# Patient Record
Sex: Male | Born: 1970
Health system: Southern US, Community
[De-identification: ages and names within clinical notes are randomized; demographics above are authoritative.]

## PROBLEM LIST (undated history)

## (undated) DIAGNOSIS — G4730 Sleep apnea, unspecified: Secondary | ICD-10-CM

## (undated) DIAGNOSIS — M1990 Unspecified osteoarthritis, unspecified site: Secondary | ICD-10-CM

## (undated) DIAGNOSIS — E785 Hyperlipidemia, unspecified: Secondary | ICD-10-CM

## (undated) HISTORY — DX: Sleep apnea, unspecified: G47.30

## (undated) HISTORY — DX: Unspecified osteoarthritis, unspecified site: M19.90

## (undated) HISTORY — DX: Hyperlipidemia, unspecified: E78.5

---
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
## 2008-01-24 ENCOUNTER — Emergency Department (HOSPITAL_COMMUNITY): Admission: EM | Admit: 2008-01-24 | Discharge: 2008-01-24 | Payer: Self-pay | Admitting: Internal Medicine

## 2008-10-04 ENCOUNTER — Encounter: Admission: RE | Admit: 2008-10-04 | Discharge: 2008-10-04 | Payer: Self-pay | Admitting: Family Medicine

## 2011-01-11 IMAGING — US US ABDOMEN COMPLETE
1 series · 14 of 25 positions shown · non-contrast
Comparison: 

CLINICAL DATA: Right upper quadrant pain.

ABDOMINAL ULTRASOUND COMPLETE

[Series 1: us abdomen complete · 14 of 57 slices shown]
[im 1/57]
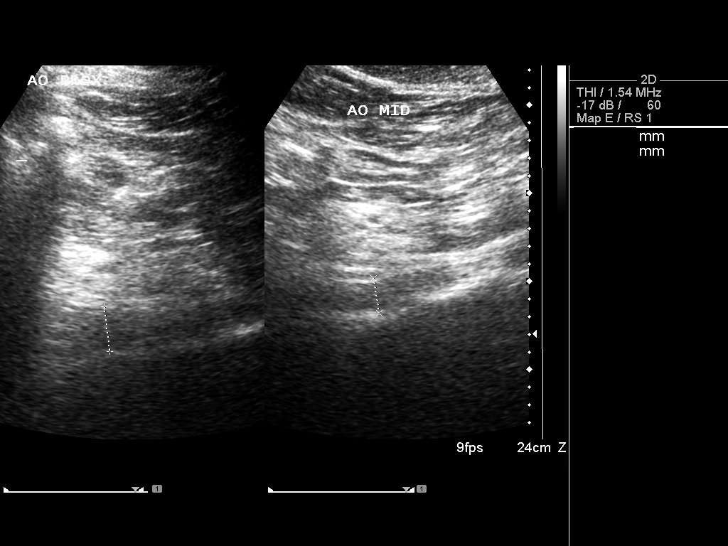
[im 5/57]
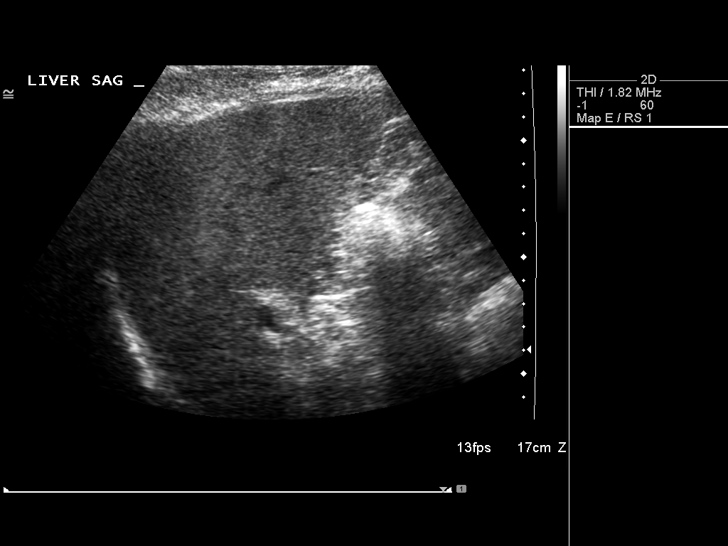
[im 10/57]
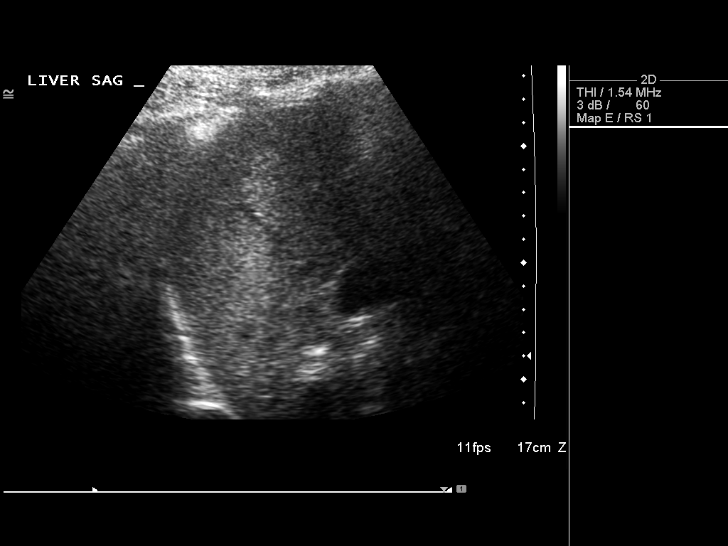
[im 15/57]
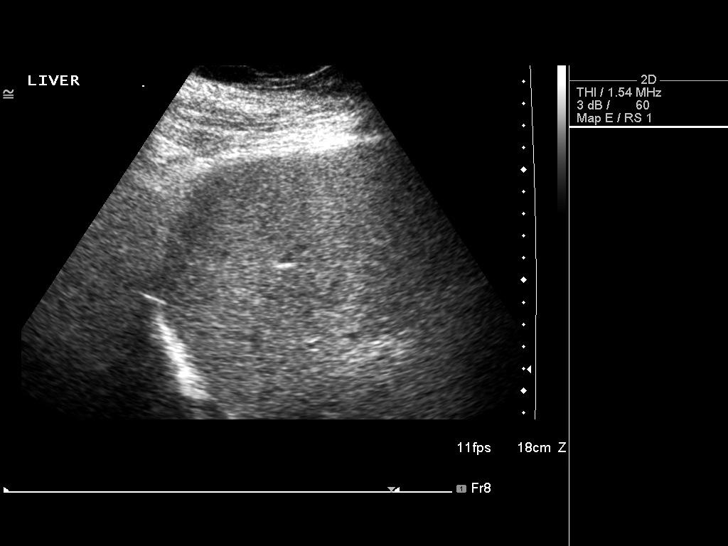
[im 19/57]
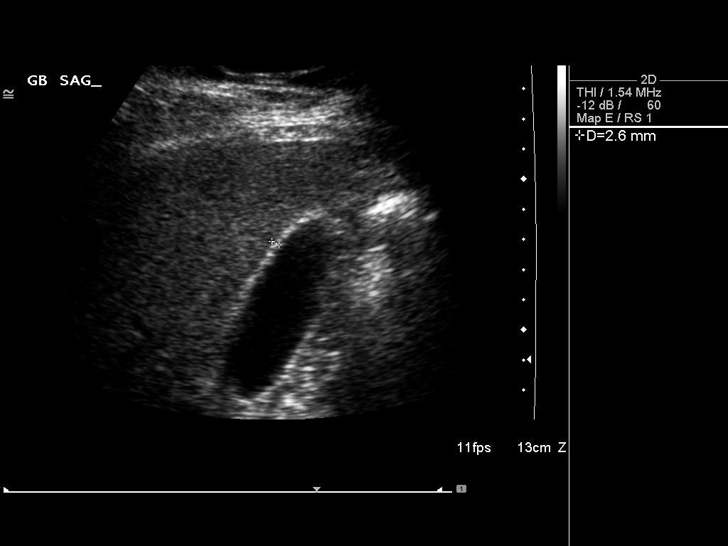
[im 22/57]
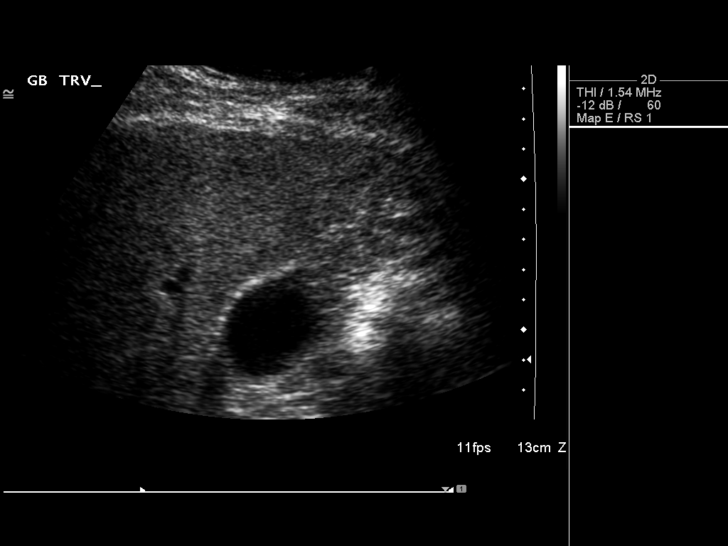
[im 26/57]
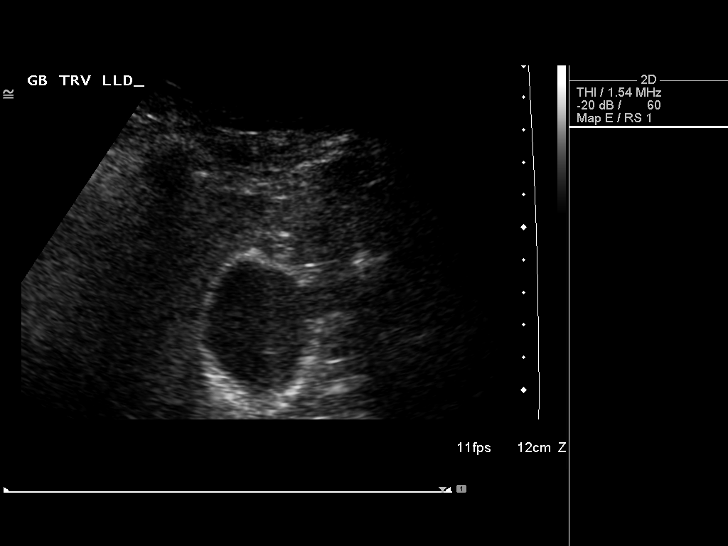
[im 31/57]
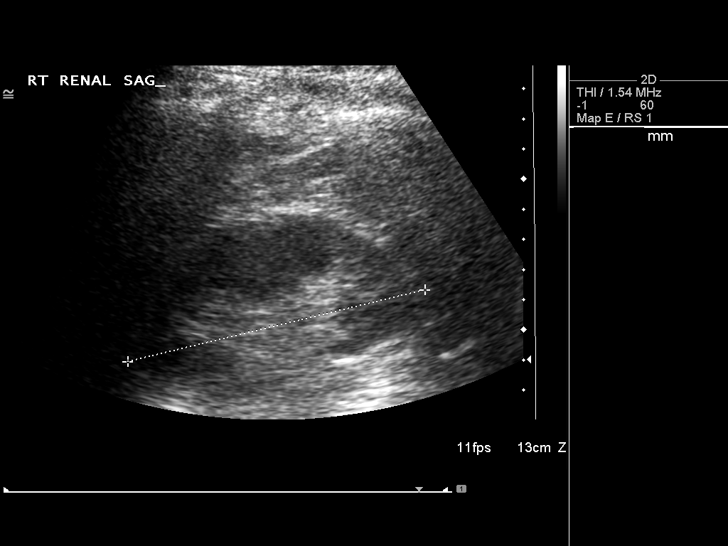
[im 36/57]
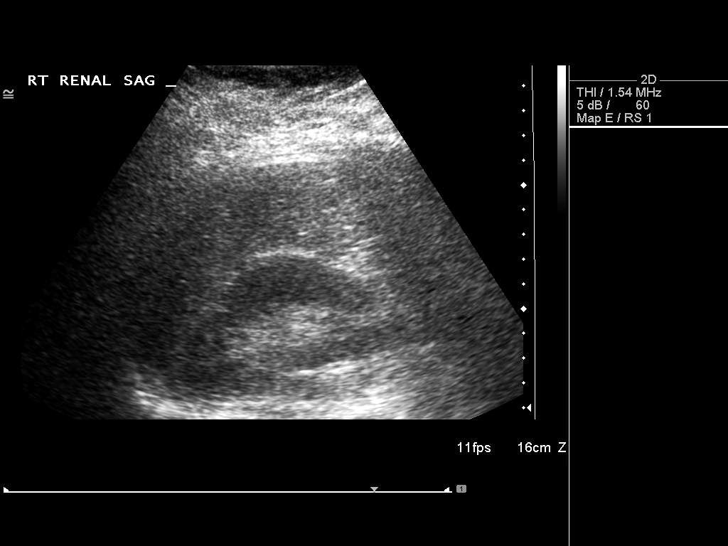
[im 38/57]
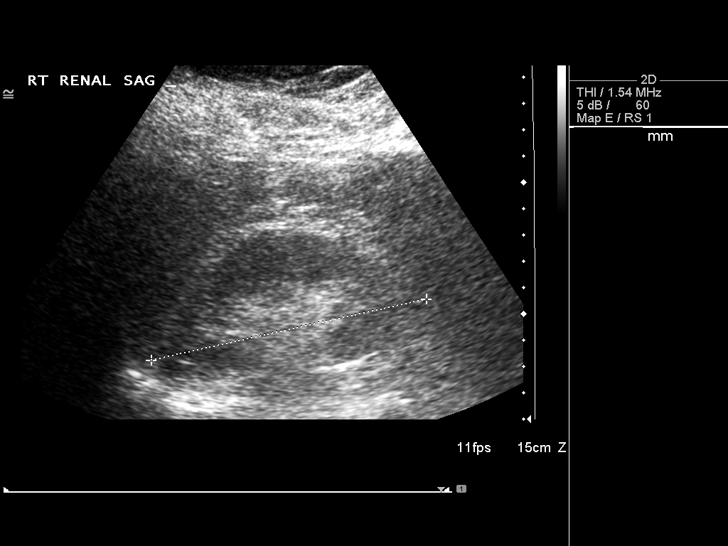
[im 43/57]
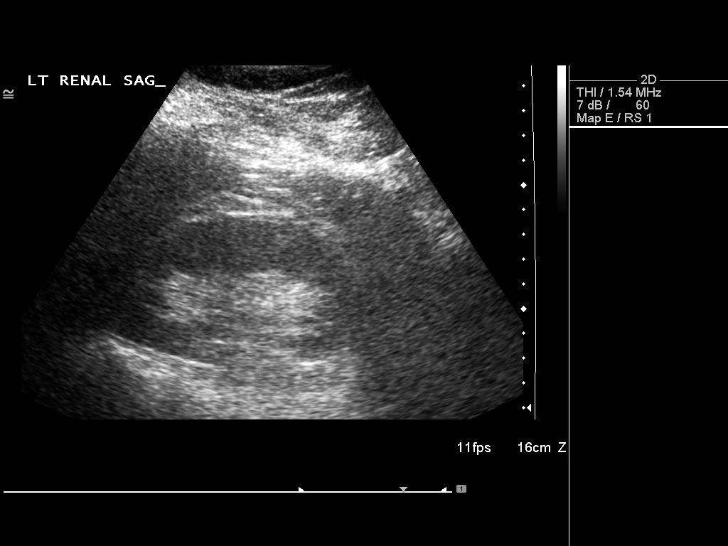
[im 47/57]
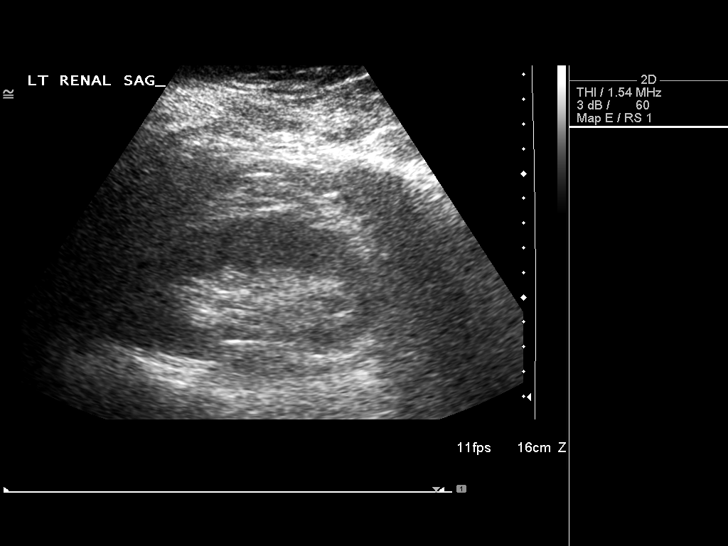
[im 52/57]
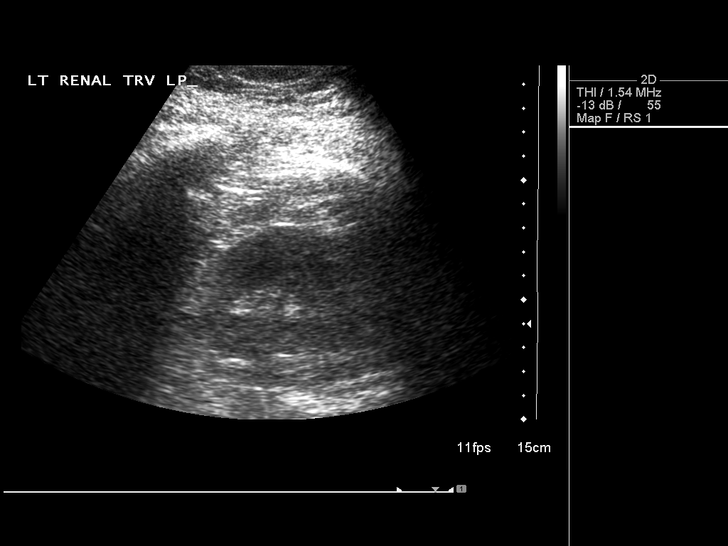
[im 57/57]
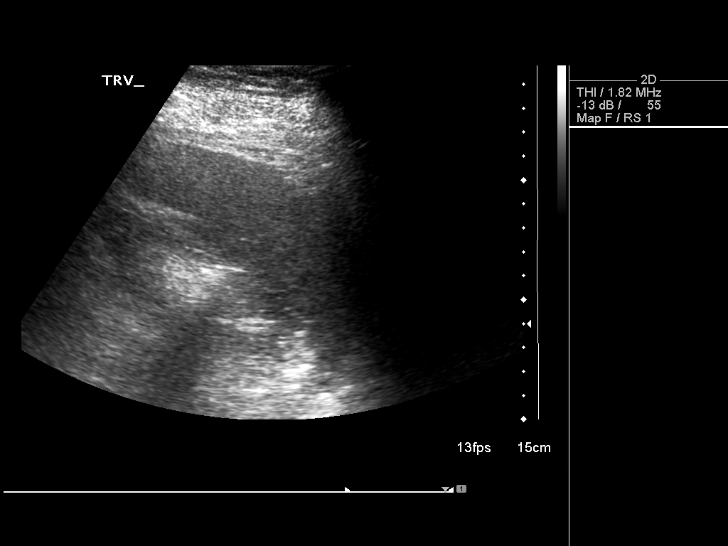

[14 of 25 positions shown; findings below may reference images not displayed]

FINDINGS: Gallbladder:  No gallstones, gallbladder wall thickening, or
pericholecystic fluid.

Common Bile Duct:  Within normal limits in caliber.

Liver:  No focal lesion identified.  Within normal limits in
parenchymal echogenicity.

IVC:  Appears normal.

Pancreas:  Obscured.

Spleen:  Within normal limits in size and echotexture.

Right kidney: Normal in size and parenchymal echogenicity.  No
evidence of mass or hydronephrosis.

Left kidney: Normal in size and parenchymal echogenicity.  No
evidence of mass or hydronephrosis.

Abdominal Aorta:  No aneurysm identified.
IMPRESSION: Pancreas obscured.  Otherwise within normal limits.

## 2012-05-11 HISTORY — PX: VASECTOMY: SHX75

## 2013-02-27 ENCOUNTER — Encounter (HOSPITAL_COMMUNITY): Payer: Self-pay | Admitting: Emergency Medicine

## 2013-02-27 ENCOUNTER — Emergency Department (HOSPITAL_COMMUNITY): Payer: BC Managed Care – PPO

## 2013-02-27 ENCOUNTER — Emergency Department (HOSPITAL_COMMUNITY)
Admission: EM | Admit: 2013-02-27 | Discharge: 2013-02-27 | Disposition: A | Discharge status: Home / Self Care | Payer: BC Managed Care – PPO | Attending: Emergency Medicine | Admitting: Emergency Medicine

## 2013-02-27 DIAGNOSIS — Y929 Unspecified place or not applicable: Secondary | ICD-10-CM | POA: Insufficient documentation

## 2013-02-27 DIAGNOSIS — W11XXXA Fall on and from ladder, initial encounter: Secondary | ICD-10-CM | POA: Insufficient documentation

## 2013-02-27 DIAGNOSIS — Z880 Allergy status to penicillin: Secondary | ICD-10-CM | POA: Insufficient documentation

## 2013-02-27 DIAGNOSIS — Y939 Activity, unspecified: Secondary | ICD-10-CM | POA: Insufficient documentation

## 2013-02-27 DIAGNOSIS — S62308A Unspecified fracture of other metacarpal bone, initial encounter for closed fracture: Secondary | ICD-10-CM

## 2013-02-27 DIAGNOSIS — Z79899 Other long term (current) drug therapy: Secondary | ICD-10-CM | POA: Insufficient documentation

## 2013-02-27 DIAGNOSIS — S62339A Displaced fracture of neck of unspecified metacarpal bone, initial encounter for closed fracture: Secondary | ICD-10-CM | POA: Insufficient documentation

## 2013-02-27 NOTE — ED Notes (Signed)
Called ortho.  They are on their way to come place splint.

## 2013-02-27 NOTE — ED Notes (Signed)
Pt reports fell off 93ft ladder from the 3-4 step on Saturday landing onto right hand. Pt presents with swelling to right hand.

## 2013-02-27 NOTE — ED Provider Notes (Signed)
CSN: [id number]     Arrival date & time 02/27/13  0819 History  This chart was scribed for non-physician practitioner [name], PA-C, working with Flint Melter, MD by [name], ED Scribe. This patient was seen in room TR07C/TR07C and the patient's care was started at 9:19 AM.    Chief Complaint  Patient presents with  . Hand Injury    Right hand   The history is provided by the patient. No language interpreter was used.   HPI Comments: Mike Kramer is a 42 y.o. male who presents to the Emergency Department complaining of an injury to the right hand that occurred 2 days ago when the patient states that he fell from a ladder, around 3 feet hight, and landed on his hand.  He states he used his hand to help break his fall.  He landed with his hand in a fist hitting the ground.  No other injuries including head trauma with LOC.  No neck pain, back pain, or other trauma.  Patient reports an associated mild pain and swelling to the area. He reports applying ice to the area and taking ibuprofen at home with mild, temporary relief. He denies any numbness, loss of sensation, or weakness. He reports a prior fracture to the right hand several years ago that is well-healed without complications. Patient denies any other injuries. Patient denies any other pertinent medical history.    History reviewed. No pertinent past medical history. Past Surgical History  Procedure Laterality Date  . Knee surgery Right    No family history on file. History  Substance Use Topics  . Smoking status: Never Smoker   . Smokeless tobacco: Not on file  . Alcohol Use: No    Review of Systems  Constitutional: Negative for fever, chills, activity change, appetite change and fatigue.  Eyes: Negative for visual disturbance.  Respiratory: Negative for shortness of breath.   Cardiovascular: Negative for chest pain.  Gastrointestinal: Negative for nausea, vomiting and abdominal pain.  Musculoskeletal: Positive  for joint swelling. Negative for arthralgias, back pain, gait problem, myalgias, neck pain and neck stiffness.  Skin: Negative for color change and wound.  Neurological: Negative for dizziness, weakness, light-headedness, numbness and headaches.  All other systems reviewed and are negative.    Allergies  Penicillins  Home Medications   Current Outpatient Rx  Name  Route  Sig  Dispense  Refill  . FLUoxetine (PROZAC) 10 MG capsule   Oral   Take 10 mg by mouth at bedtime.          Triage Vitals: BP 132/83  Pulse 77  Temp(Src) 98 F (36.7 C)  Resp 18  Ht 5\' 10"  (1.778 m)  Wt 275 lb (124.739 kg)  BMI 39.46 kg/m2  SpO2 93%  Filed Vitals:   02/27/13 0822  BP: 132/83  Pulse: 77  Temp: 98 F (36.7 C)  Resp: 18  Height: 5\' 10"  (1.778 m)  Weight: 275 lb (124.739 kg)  SpO2: 93%    Physical Exam  Nursing note and vitals reviewed. Constitutional: He is oriented to person, place, and time. He appears well-developed and well-nourished. No distress.  HENT:  Head: Normocephalic and atraumatic.  Right Ear: External ear normal.  Left Ear: External ear normal.  Mouth/Throat: Oropharynx is clear and moist.  Eyes: Conjunctivae are normal. Pupils are equal, round, and reactive to light. Right eye exhibits no discharge. Left eye exhibits no discharge.  Neck: Normal range of motion. Neck supple.  Cardiovascular: Normal  rate, regular rhythm and normal heart sounds.  Exam reveals no gallop and no friction rub.   No murmur heard. Radial pulses present bilaterally. Capillary refill <2 seconds   Pulmonary/Chest: Effort normal and breath sounds normal. No respiratory distress. He has no wheezes. He has no rales. He exhibits no tenderness.  Abdominal: Soft. He exhibits no distension. There is no tenderness.  Musculoskeletal: Normal range of motion. He exhibits no edema and no tenderness.  Tenderness to palpation to the 4th and 5th MC joints and metacarpals of the right hand.  No tenderness  to palpation to the digits, forearm, wrist, or elbow on the right.  No snuffbox tenderness.  Patient able to actively flex and extend digits of the right hand at [facility], DIP, and PIP joints.  Patient has some limitations with ROM of the right 5th MC joint due to pain and edema.  No limitations with active circumduction of the right wrist.    Neurological: He is alert and oriented to person, place, and time.  Distal sensation of the right hand is intact.   Skin: Skin is warm and dry. He is not diaphoretic.  Edema to the dorsum of the right hand throughout more pronounced on the lateral side of the hand.  No erythema or open wounds.   Psychiatric: He has a normal mood and affect. His behavior is normal.    ED Course  Procedures (including critical care time)  DIAGNOSTIC STUDIES: Oxygen Saturation is 93% on room air, adequate by my interpretation.    COORDINATION OF CARE: 9:20 AM- Discussed that x-ray results indicate a fracture. Will discharge patient with a splint. Offered patient pain medication, but he refused because he states that the pain is not that severe. Discussed treatment plan with patient at bedside and patient verbalized agreement.   10:34 AM- Splint is in place. Advised patient to follow up with the referred hand surgeon. Advised patient to follow RICE procedures and take ibuprofen at home until symptoms subside. Discussed treatment plan with patient at bedside and patient verbalized agreement.    Labs Review Labs Reviewed - No data to display  Imaging Review Dg Hand Complete Right  02/27/2013   CLINICAL DATA:  Fall from a ladder 2 days ago. Hand pain and swelling.  EXAM: RIGHT HAND - COMPLETE 3+ VIEW  COMPARISON:  01/24/2008  FINDINGS: Acute fracture, distal 5th metacarpal, extending from the radial side of the distal metaphysis to the medial distal articular surface as shown on the frontal projection. There is apex posterior angulation.  Deformity of the base of the 5th  metatarsal is from old fracture. Suspected accessory ossicle along the dorsum of the carpus.  IMPRESSION: 1. Distal 5th metacarpal fracture extends from the metaphysis to the medial articular surface. 2. Deformity at the base of the 5th metacarpal is from a remote fracture.   Electronically Signed   By: Herbie Baltimore M.D.   On: 02/27/2013 09:08    EKG Interpretation   None           DG Hand Complete Right (Final result)  Result time: 02/27/13 09:08:48    Final result by Rad Results In Interface (02/27/13 09:08:48)    Narrative:   CLINICAL DATA: Fall from a ladder 2 days ago. Hand pain and swelling.  EXAM: RIGHT HAND - COMPLETE 3+ VIEW  COMPARISON: 01/24/2008  FINDINGS: Acute fracture, distal 5th metacarpal, extending from the radial side of the distal metaphysis to the medial distal articular surface as shown on the  frontal projection. There is apex posterior angulation.  Deformity of the base of the 5th metatarsal is from old fracture. Suspected accessory ossicle along the dorsum of the carpus.  IMPRESSION: 1. Distal 5th metacarpal fracture extends from the metaphysis to the medial articular surface. 2. Deformity at the base of the 5th metacarpal is from a remote fracture.   Electronically Signed By: Herbie Baltimore M.D. On: 02/27/2013 09:08          MDM   1. Closed fracture of 5th metacarpal, initial encounter    LENWOOD BALSAM is a 42 y.o. male who presents to the Emergency Department complaining of an injury to the right hand that occurred 2 days ago when the patient states that he fell from a ladder, around 3 feet hight, and landed on his hand.    Rechecks  10:34 AM = Re-evaluated splint.  Neurovascularly intact.    Etiology of right hand pain is likely due to a distal 5th metacarpal fracture.  Splint applied.  Patient was neurovascularly intact.  Patient given referral to orthopedics and instructed to make an appointment as soon as possible.   Patient declined pain medication.  Patient instructed to rest, ice, elevate, and take Ibuprofen/Tylenol for pain.  Instructed to return to the ED if he had any weakness, loss of sensation, cyanosis, or other concerns.  Patient in agreement with discharge and plan.     Final impressions: 1. Closed fracture of 5th metacarpal, right     Luiz Iron PA-C   I personally performed the services described in this documentation, which was scribed in my presence. The recorded information has been reviewed and is accurate.    Jillyn Ledger, PA-C 03/02/13 1214

## 2013-02-27 NOTE — Progress Notes (Signed)
Orthopedic Tech Progress Note Patient Details:  Mike Kramer [birth year] [id number] Applied fiberglass ulnar gutter splint to RUE.  Pulses, capillary refill, motion and sensation intact before and after splinting.  Capillary refill less than 2 seconds. Ortho Devices Type of Ortho Device: Ulna gutter splint Ortho Device/Splint Location: RUE Ortho Device/Splint Interventions: Application   Lesle Chris 02/27/2013, 9:58 AM

## 2013-03-03 NOTE — ED Provider Notes (Signed)
Medical screening examination/treatment/procedure(s) were performed by non-physician practitioner and as supervising physician I was immediately available for consultation/collaboration.  Flint Melter, MD 03/03/13 [id number]

## 2014-12-28 ENCOUNTER — Encounter: Payer: Self-pay | Admitting: Internal Medicine

## 2015-01-23 ENCOUNTER — Encounter: Payer: Self-pay | Admitting: Internal Medicine

## 2015-01-23 ENCOUNTER — Ambulatory Visit (INDEPENDENT_AMBULATORY_CARE_PROVIDER_SITE_OTHER): Payer: BLUE CROSS/BLUE SHIELD | Admitting: Internal Medicine

## 2015-01-23 ENCOUNTER — Ambulatory Visit: Payer: Self-pay | Admitting: Internal Medicine

## 2015-01-23 VITALS — BP 130/88 | HR 72 | Temp 97.3°F | Resp 18 | Ht 70.5 in | Wt 292.6 lb

## 2015-01-23 DIAGNOSIS — E7800 Pure hypercholesterolemia, unspecified: Secondary | ICD-10-CM

## 2015-01-23 DIAGNOSIS — E559 Vitamin D deficiency, unspecified: Secondary | ICD-10-CM

## 2015-01-23 DIAGNOSIS — R7309 Other abnormal glucose: Secondary | ICD-10-CM

## 2015-01-23 DIAGNOSIS — Z6841 Body Mass Index (BMI) 40.0 and over, adult: Secondary | ICD-10-CM | POA: Diagnosis not present

## 2015-01-23 DIAGNOSIS — E785 Hyperlipidemia, unspecified: Secondary | ICD-10-CM | POA: Diagnosis not present

## 2015-01-23 DIAGNOSIS — I10 Essential (primary) hypertension: Secondary | ICD-10-CM | POA: Diagnosis not present

## 2015-01-23 DIAGNOSIS — Z79899 Other long term (current) drug therapy: Secondary | ICD-10-CM

## 2015-01-23 DIAGNOSIS — E663 Overweight: Secondary | ICD-10-CM | POA: Insufficient documentation

## 2015-01-23 DIAGNOSIS — R5383 Other fatigue: Secondary | ICD-10-CM

## 2015-01-23 LAB — LIPID PANEL
Cholesterol: 244 mg/dL — ABNORMAL HIGH (ref 125–200)
HDL: 40 mg/dL (ref >=40)
LDL Cholesterol: 161 mg/dL — ABNORMAL HIGH (ref <130)
Total CHOL/HDL Ratio: 6.1 Ratio — ABNORMAL HIGH (ref <=5.0)
Triglycerides: 214 mg/dL — ABNORMAL HIGH (ref <150)
VLDL: 43 mg/dL — ABNORMAL HIGH (ref <30)

## 2015-01-23 LAB — BASIC METABOLIC PANEL WITH GFR
BUN: 12 mg/dL (ref 7–25)
CO2: 25 mmol/L (ref 20–31)
Calcium: 9.6 mg/dL (ref 8.6–10.3)
Chloride: 103 mmol/L (ref 98–110)
Creat: 0.86 mg/dL (ref 0.60–1.35)
GFR, Est African American: >89 mL/min (ref >=60)
GFR, Est Non African American: >89 mL/min (ref >=60)
Glucose, Bld: 91 mg/dL (ref 65–99)
Potassium: 4.2 mmol/L (ref 3.5–5.3)
Sodium: 140 mmol/L (ref 135–146)

## 2015-01-23 LAB — CBC WITH DIFFERENTIAL/PLATELET
Basophils Absolute: 0.1 10*3/uL (ref 0.0–0.1)
Basophils Relative: 1 % (ref 0–1)
Eosinophils Absolute: 0.2 10*3/uL (ref 0.0–0.7)
Eosinophils Relative: 3 % (ref 0–5)
HCT: 46.6 % (ref 39.0–52.0)
Hemoglobin: 16 g/dL (ref 13.0–17.0)
Lymphocytes Relative: 27 % (ref 12–46)
Lymphs Abs: 2 10*3/uL (ref 0.7–4.0)
MCH: 31 pg (ref 26.0–34.0)
MCHC: 34.3 g/dL (ref 30.0–36.0)
MCV: 90.3 fL (ref 78.0–100.0)
MPV: 9.7 fL (ref 8.6–12.4)
Monocytes Absolute: 0.6 10*3/uL (ref 0.1–1.0)
Monocytes Relative: 8 % (ref 3–12)
Neutro Abs: 4.6 10*3/uL (ref 1.7–7.7)
Neutrophils Relative %: 61 % (ref 43–77)
Platelets: 223 10*3/uL (ref 150–400)
RBC: 5.16 MIL/uL (ref 4.22–5.81)
RDW: 14 % (ref 11.5–15.5)
WBC: 7.5 10*3/uL (ref 4.0–10.5)

## 2015-01-23 LAB — MAGNESIUM: Magnesium: 2 mg/dL (ref 1.5–2.5)

## 2015-01-23 LAB — HEPATIC FUNCTION PANEL
ALT: 36 U/L (ref 9–46)
AST: 27 U/L (ref 10–40)
Albumin: 4.4 g/dL (ref 3.6–5.1)
Alkaline Phosphatase: 74 U/L (ref 40–115)
Bilirubin, Direct: 0.1 mg/dL (ref <=0.2)
Indirect Bilirubin: 0.7 mg/dL (ref 0.2–1.2)
Total Bilirubin: 0.8 mg/dL (ref 0.2–1.2)
Total Protein: 7 g/dL (ref 6.1–8.1)

## 2015-01-23 LAB — IRON AND TIBC
%SAT: 28 % (ref 15–60)
Iron: 101 ug/dL (ref 50–180)
TIBC: 362 ug/dL (ref 250–425)
UIBC: 261 ug/dL (ref 125–400)

## 2015-01-23 LAB — TSH: TSH: 1.717 u[IU]/mL (ref 0.350–4.500)

## 2015-01-23 LAB — HEMOGLOBIN A1C
Hgb A1c MFr Bld: 5.6 % (ref <5.7)
Mean Plasma Glucose: 114 mg/dL (ref <117)

## 2015-01-23 MED ORDER — BISOPROLOL-HYDROCHLOROTHIAZIDE 5-6.25 MG PO TABS: ORAL_TABLET | ORAL | Status: DC

## 2015-01-23 MED ORDER — SERTRALINE HCL 50 MG PO TABS: 50.0000 mg | ORAL_TABLET | Freq: Every day | ORAL | Status: DC

## 2015-01-23 NOTE — Patient Instructions (Signed)
Recommend Adult Low dose Aspirin or   coated  Aspirin 81 mg daily   To reduce risk of Colon Cancer 20 %,   Skin Cancer 26 % ,   Melanoma 46%   and   Pancreatic cancer 60%  ++++++++++++++++++  Vitamin D goal   is between 70-100.   Please make sure that you are taking your Vitamin D as directed.   It is very important as a natural anti-inflammatory   helping hair, skin, and nails, as well as reducing stroke and heart attack risk.   It helps your bones and helps with mood.  It also decreases numerous cancer risks so please take it as directed.   Low Vit D is associated with a 200-300% higher risk for CANCER   and 200-300% higher risk for HEART   ATTACK  &  STROKE.   .....................................Marland Kitchen  It is also associated with higher death rate at younger ages,   autoimmune diseases like Rheumatoid arthritis, Lupus, Multiple Sclerosis.     Also many other serious conditions, like depression, Alzheimer's  Dementia, infertility, muscle aches, fatigue, fibromyalgia - just to name a few.  +++++++++++++++++++  Recommend the book "The END of DIETING" by Dr Monico Hoar   & the book "The END of DIABETES " by Dr Monico Hoar  At [email or web address] - get book & Audio CD's     Being diabetic has a  300% increased risk for heart attack, stroke, cancer, and alzheimer- type vascular dementia. It is very important that you work harder with diet by avoiding all foods that are white. Avoid white rice (brown & wild rice is OK), white potatoes (sweetpotatoes in moderation is OK), White bread or wheat bread or anything made out of white flour like bagels, donuts, rolls, buns, biscuits, cakes, pastries, cookies, pizza crust, and pasta (made from white flour & egg whites) - vegetarian pasta or spinach or wheat pasta is OK. Multigrain breads like Arnold's or [employer], or multigrain sandwich thins or flatbreads.  Diet, exercise and weight loss can reverse and cure diabetes in the early  stages.  Diet, exercise and weight loss is very important in the control and prevention of complications of diabetes which affects every system in your body, ie. Brain - dementia/stroke, eyes - glaucoma/blindness, heart - heart attack/heart failure, kidneys - dialysis, stomach - gastric paralysis, intestines - malabsorption, nerves - severe painful neuritis, circulation - gangrene & loss of a leg(s), and finally cancer and Alzheimers.    I recommend avoid fried & greasy foods,  sweets/candy, white rice (brown or wild rice or Quinoa is OK), white potatoes (sweet potatoes are OK) - anything made from white flour - bagels, doughnuts, rolls, buns, biscuits,white and wheat breads, pizza crust and traditional pasta made of white flour & egg white(vegetarian pasta or spinach or wheat pasta is OK).  Multi-grain bread is OK - like multi-grain flat bread or sandwich thins. Avoid alcohol in excess. Exercise is also important.    Eat all the vegetables you want - avoid meat, especially red meat and dairy - especially cheese.  Cheese is the most concentrated form of trans-fats which is the worst thing to clog up our arteries. Veggie cheese is OK which can be found in the fresh produce section at [employer] or [employer] or [employer]   +++++++++++++++++++++++++++++++++++ Hypertension As your heart beats, it forces blood through your arteries. This force is your blood pressure. If the pressure is too high, it is called hypertension (HTN) or high  blood pressure. HTN is dangerous because you may have it and not know it. High blood pressure may mean that your heart has to work harder to pump blood. Your arteries may be narrow or stiff. The extra work puts you at risk for heart disease, stroke, and other problems.  Blood pressure consists of two numbers, a higher number over a lower, 110/72, for example. It is stated as "110 over 72." The ideal is below 120 for the top number (systolic) and under 80 for the bottom  (diastolic). Write down your blood pressure today. You should pay close attention to your blood pressure if you have certain conditions such as:  Heart failure.  Prior heart attack.  Diabetes  Chronic kidney disease.  Prior stroke.  Multiple risk factors for heart disease. To see if you have HTN, your blood pressure should be measured while you are seated with your arm held at the level of the heart. It should be measured at least twice. A one-time elevated blood pressure reading (especially in the Emergency Department) does not mean that you need treatment. There may be conditions in which the blood pressure is different between your right and left arms. It is important to see your caregiver soon for a recheck. Most people have essential hypertension which means that there is not a specific cause. This type of high blood pressure may be lowered by changing lifestyle factors such as:  Stress.  Smoking.  Lack of exercise.  Excessive weight.  Drug/tobacco/alcohol use.  Eating less salt. Most people do not have symptoms from high blood pressure until it has caused damage to the body. Effective treatment can often prevent, delay or reduce that damage. TREATMENT  When a cause has been identified, treatment for high blood pressure is directed at the cause. There are a large number of medications to treat HTN. These fall into several categories, and your caregiver will help you select the medicines that are best for you. Medications may have side effects. You should review side effects with your caregiver. If your blood pressure stays high after you have made lifestyle changes or started on medicines,   Your medication(s) may need to be changed.  Other problems may need to be addressed.  Be certain you understand your prescriptions, and know how and when to take your medicine.  Be sure to follow up with your caregiver within the time frame advised (usually within two weeks) to have  your blood pressure rechecked and to review your medications.  If you are taking more than one medicine to lower your blood pressure, make sure you know how and at what times they should be taken. Taking two medicines at the same time can result in blood pressure that is too low. SEEK IMMEDIATE MEDICAL CARE IF:  You develop a severe headache, blurred or changing vision, or confusion.  You have unusual weakness or numbness, or a faint feeling.  You have severe chest or abdominal pain, vomiting, or breathing problems. MAKE SURE YOU:   Understand these instructions.  Will watch your condition.  Will get help right away if you are not doing well or get worse.  ++++++++++++++++++++++++++++  Cholesterol Cholesterol is a white, waxy, fat-like protein needed by your body in small amounts. The liver makes all the cholesterol you need. It is carried from the liver by the blood through the blood vessels. Deposits (plaque) may build up on blood vessel walls. This makes the arteries narrower and stiffer. Plaque increases the risk for  heart attack and stroke. You cannot feel your cholesterol level even if it is very high. The only way to know is by a blood test to check your lipid (fats) levels. Once you know your cholesterol levels, you should keep a record of the test results. Work with your caregiver to to keep your levels in the desired range. WHAT THE RESULTS MEAN:  Total cholesterol is a rough measure of all the cholesterol in your blood.  LDL is the so-called bad cholesterol. This is the type that deposits cholesterol in the walls of the arteries. You want this level to be low.  HDL is the good cholesterol because it cleans the arteries and carries the LDL away. You want this level to be high.  Triglycerides are fat that the body can either burn for energy or store. High levels are closely linked to heart disease. DESIRED LEVELS:  Total cholesterol below 200.  LDL below 100 for people at  risk, below 70 for very high risk.  HDL above 50 is good, above 60 is best.  Triglycerides below 150. HOW TO LOWER YOUR CHOLESTEROL:  Diet.  Choose fish or white meat chicken and [place], roasted or baked. Limit fatty cuts of red meat, fried foods, and processed meats, such as sausage and lunch meat.  Eat lots of fresh fruits and vegetables. Choose whole grains, beans, pasta, potatoes and cereals.  Use only small amounts of olive, corn or canola oils. Avoid butter, mayonnaise, shortening or palm kernel oils. Avoid foods with trans-fats.  Use skim/nonfat milk and low-fat/nonfat yogurt and cheeses. Avoid whole milk, cream, ice cream, egg yolks and cheeses. Healthy desserts include angel food cake, ginger snaps, animal crackers, hard candy, popsicles, and low-fat/nonfat frozen yogurt. Avoid pastries, cakes, pies and cookies.  Exercise.  A regular program helps decrease LDL and raises HDL.  Helps with weight control.  Do things that increase your activity level like gardening, walking, or taking the stairs.  Medication.  May be prescribed by your caregiver to help lowering cholesterol and the risk for heart disease.  You may need medicine even if your levels are normal if you have several risk factors. HOME CARE INSTRUCTIONS   Follow your diet and exercise programs as suggested by your caregiver.  Take medications as directed.  Have blood work done when your caregiver feels it is necessary. MAKE SURE YOU:   Understand these instructions.  Will watch your condition.  Will get help right away if you are not doing well or get worse.  +++++++++++++++++++++++++++++++  Vitamin D Deficiency Vitamin D is an important vitamin that your body needs. Having too little of it in your body is called a deficiency. A very bad deficiency can make your bones soft and can cause a condition called rickets.  Vitamin D is important to your body for different reasons, such as:   It helps your  body absorb 2 minerals called calcium and phosphorus.  It helps make your bones healthy.  It may prevent some diseases, such as diabetes and multiple sclerosis.  It helps your muscles and heart. You can get vitamin D in several ways. It is a natural part of some foods. The vitamin is also added to some dairy products and cereals. Some people take vitamin D supplements. Also, your body makes vitamin D when you are in the sun. It changes the sun's rays into a form of the vitamin that your body can use. CAUSES   Not eating enough foods that contain vitamin  D.  Not getting enough sunlight.  Having certain digestive system diseases that make it hard to absorb vitamin D. These diseases include Crohn's disease, chronic pancreatitis, and cystic fibrosis.  Having a surgery in which part of the stomach or small intestine is removed.  Being obese. Fat cells pull vitamin D out of your blood. That means that obese people may not have enough vitamin D left in their blood and in other body tissues.  Having chronic kidney or liver disease. RISK FACTORS Risk factors are things that make you more likely to develop a vitamin D deficiency. They include:  Being older.  Not being able to get outside very much.  Living in a nursing home.  Having had broken bones.  Having weak or thin bones (osteoporosis).  Having a disease or condition that changes how your body absorbs vitamin D.  Having dark skin.  Some medicines such as seizure medicines or steroids.  Being overweight or obese. SYMPTOMS Mild cases of vitamin D deficiency may not have any symptoms. If you have a very bad case, symptoms may include:  Bone pain.  Muscle pain.  Falling often.  Broken bones caused by a minor injury, due to osteoporosis. DIAGNOSIS A blood test is the best way to tell if you have a vitamin D deficiency. TREATMENT Vitamin D deficiency can be treated in different ways. Treatment for vitamin D deficiency  depends on what is causing it. Options include:  Taking vitamin D supplements.  Taking a calcium supplement. Your caregiver will suggest what dose is best for you. HOME CARE INSTRUCTIONS  Take any supplements that your caregiver prescribes. Follow the directions carefully. Take only the suggested amount.  Have your blood tested 2 months after you start taking supplements.  Eat foods that contain vitamin D. Healthy choices include:  Fortified dairy products, cereals, or juices. Fortified means vitamin D has been added to the food. Check the label on the package to be sure.  Fatty fish like salmon or trout.  Eggs.  Oysters.  Do not use a tanning bed.  Keep your weight at a healthy level. Lose weight if you need to.  Keep all follow-up appointments. Your caregiver will need to perform blood tests to make sure your vitamin D deficiency is going away. SEEK MEDICAL CARE IF:  You have any questions about your treatment.  You continue to have symptoms of vitamin D deficiency.  You have nausea or vomiting.  You are constipated.  You feel confused.  You have severe abdominal or back pain. MAKE SURE YOU:  Understand these instructions.  Will watch your condition.  Will get help right away if you are not doing well or get worse.

## 2015-01-23 NOTE — Progress Notes (Signed)
Patient ID: Mike Kramer, male   DOB: January 31, 1971, 44 y.o.   MRN: [id number]   This very nice 44 y.o. MWM presents for New patient evaluation with Hypertension, Hyperlipidemia, Pre-Diabetes and Vitamin D Deficiency.      Patient relates approx 1 year of known labile HTN not yet treated. Today's BP: 130/88 mmHg. Patient has had no complaints of any cardiac type chest pain, palpitations, dyspnea/orthopnea/PND, claudication or dependent edema. He does report occasional dizziness.     Patient relates hx/o elevated lipids which have not been treated. Today's Lipids were not at goal - Cholesterol 244*; HDL 40; LDL 161*; Triglycerides 214 on  01/23/2015.     Also, the patient has Morbid Obesity - Weight 292 # and BMI 41.38 and is screened expectantly for PreDiabetes and has had no symptoms of reactive hypoglycemia, diabetic polys, paresthesias or visual blurring. No Exercise.       Further, the patient is being screened expectantly for Vitamin D Deficiency.  Outpatient Prescriptions Prior to Visit  Medication Sig Dispense Refill  . Venlafaxine 75 XR Take 1 daily bedtime.     Allergies  Allergen Reactions  . Penicillins Hives    Childhood allergy   PMHx:    Past Surgical History  Procedure Laterality Date  . Knee surgery Right    FHx:    Reviewed / unchanged  SHx:    Reviewed / unchanged  Systems Review:  Constitutional: Denies fever, chills, wt changes, headaches, insomnia, fatigue, night sweats, change in appetite. Eyes: Denies redness, blurred vision, diplopia, discharge, itchy, watery eyes.  ENT: Denies discharge, congestion, post nasal drip, epistaxis, sore throat, earache, hearing loss, dental pain, tinnitus, vertigo, sinus pain, snoring.  CV: Denies chest pain, palpitations, irregular heartbeat, syncope, dyspnea, diaphoresis, orthopnea, PND, claudication or edema. Respiratory: denies cough, dyspnea, DOE, pleurisy, hoarseness, laryngitis, wheezing.  Gastrointestinal: Denies dysphagia,  odynophagia, heartburn, reflux, water brash, abdominal pain or cramps, nausea, vomiting, bloating, diarrhea, constipation, hematemesis, melena, hematochezia  or hemorrhoids. Genitourinary: Denies dysuria, frequency, urgency, nocturia, hesitancy, discharge, hematuria or flank pain. Musculoskeletal: Denies arthralgias, myalgias, stiffness, jt. swelling, pain, limping or strain/sprain.  Skin: Denies pruritus, rash, hives, warts, acne, eczema or change in skin lesion(s). Neuro: No weakness, tremor, incoordination, spasms, paresthesia or pain. Psychiatric: Denies confusion, memory loss or sensory loss. Endo: Denies change in weight, skin or hair change.  Heme/Lymph: No excessive bleeding, bruising or enlarged lymph nodes.  Physical Exam  BP 130/88   Pulse 72  Temp 97.3 F   Resp 18  Ht 5' 10.5"   Wt 292 lb 9.6 oz     BMI 41.38   Appears well nourished and in no distress. Eyes: PERRLA, EOMs, conjunctiva no swelling or erythema. Sinuses: No frontal/maxillary tenderness ENT/Mouth: EAC's clear, TM's nl w/o erythema, bulging. Nares clear w/o erythema, swelling, exudates. Oropharynx clear without erythema or exudates. Oral hygiene is good. Tongue normal, non obstructing. Hearing intact.  Neck: Supple. Thyroid nl. Car 2+/2+ without bruits, nodes or JVD. Chest: Respirations nl with BS clear & equal w/o rales, rhonchi, wheezing or stridor.  Cor: Heart sounds normal w/ regular rate and rhythm without sig. murmurs, gallops, clicks, or rubs. Peripheral pulses normal and equal  without edema.  Abdomen: Soft & bowel sounds normal. Non-tender w/o guarding, rebound, hernias, masses, or organomegaly.  Lymphatics: Unremarkable.  Musculoskeletal: Full ROM all peripheral extremities, joint stability, 5/5 strength, and normal gait.  Skin: Warm, dry without exposed rashes, lesions or ecchymosis apparent.  Neuro: Cranial nerves intact, reflexes equal  bilaterally. Sensory-motor testing grossly intact. Tendon  reflexes grossly intact.  Pysch: Alert & oriented x 3.  Insight and judgement nl & appropriate. No ideations.  Assessment and Plan:  1. Essential hypertension  - Microalbumin / creatinine urine ratio - TSH - bisoprolol-hctz (ZIAC) 5-6.25 ; Take 1 tab daily for BP  Dispense: 90 tablet; Refill: 1 - ROV 1 mo   2. Elevated cholesterol  - Lipid panel  3. Abnormal glucose  - Hemoglobin A1c - Insulin, random  4. Vitamin D deficiency  - Vit D  25 hydroxy   5. Other fatigue  - Testosterone - Iron and TIBC - TSH - Vitamin B12 - sertraline (ZOLOFT) 50 MG tablet; Take 1 tablet (50 mg total) by mouth daily.  Dispense: 30 tablet; Refill: 2  6. Medication management  - CBC with Differential/Platelet - BASIC METABOLIC PANEL WITH GFR - Hepatic function panel - Magnesium  7. Morbid obesity (BMI 41.38)   8. BMI 41.38   9. Hyperlipidemia     Recommended regular exercise, BP monitoring, weight control, and discussed med and SE's. Recommended labs to assess and monitor clinical status. Further disposition pending results of labs. Over 30 minutes of exam, counseling, chart review was performed

## 2015-01-24 LAB — MICROALBUMIN / CREATININE URINE RATIO
Creatinine, Urine: 143.3 mg/dL
Microalb Creat Ratio: 3.5 mg/g (ref 0.0–30.0)
Microalb, Ur: 0.5 mg/dL (ref <2.0)

## 2015-01-24 LAB — TESTOSTERONE: Testosterone: 204 ng/dL — ABNORMAL LOW (ref 300–890)

## 2015-01-24 LAB — INSULIN, RANDOM: Insulin: 17.5 u[IU]/mL (ref 2.0–19.6)

## 2015-01-24 LAB — VITAMIN D 25 HYDROXY (VIT D DEFICIENCY, FRACTURES): Vit D, 25-Hydroxy: 24 ng/mL — ABNORMAL LOW (ref 30–100)

## 2015-02-12 ENCOUNTER — Encounter: Payer: Self-pay | Admitting: Internal Medicine

## 2015-02-12 ENCOUNTER — Ambulatory Visit (INDEPENDENT_AMBULATORY_CARE_PROVIDER_SITE_OTHER): Payer: BLUE CROSS/BLUE SHIELD | Admitting: Internal Medicine

## 2015-02-12 VITALS — BP 126/84 | HR 52 | Temp 97.3°F | Resp 16 | Ht 70.5 in | Wt 290.8 lb

## 2015-02-12 DIAGNOSIS — R1013 Epigastric pain: Secondary | ICD-10-CM | POA: Diagnosis not present

## 2015-02-12 LAB — COMPREHENSIVE METABOLIC PANEL
ALT: 29 U/L (ref 9–46)
AST: 24 U/L (ref 10–40)
Albumin: 4.3 g/dL (ref 3.6–5.1)
Alkaline Phosphatase: 71 U/L (ref 40–115)
BUN: 14 mg/dL (ref 7–25)
CO2: 26 mmol/L (ref 20–31)
Calcium: 9.7 mg/dL (ref 8.6–10.3)
Chloride: 102 mmol/L (ref 98–110)
Creat: 0.87 mg/dL (ref 0.60–1.35)
Glucose, Bld: 89 mg/dL (ref 65–99)
Potassium: 4.1 mmol/L (ref 3.5–5.3)
Sodium: 136 mmol/L (ref 135–146)
Total Bilirubin: 0.7 mg/dL (ref 0.2–1.2)
Total Protein: 7 g/dL (ref 6.1–8.1)

## 2015-02-12 LAB — LIPASE: Lipase: 21 U/L (ref 7–60)

## 2015-02-12 MED ORDER — OMEPRAZOLE 40 MG PO CPDR: 40.0000 mg | DELAYED_RELEASE_CAPSULE | Freq: Every day | ORAL | Status: DC

## 2015-02-12 MED ORDER — SUCRALFATE 1 G PO TABS: 1.0000 g | ORAL_TABLET | Freq: Three times a day (TID) | ORAL | Status: DC

## 2015-02-12 NOTE — Patient Instructions (Signed)
Please start taking omeprazole daily on an empty stomach and wait 30 minutes prior to eating.    You may also take carafate up to 4 times day with meals.  This will help to coat your stomach.    I will see you back in 2 weeks.  We are checking your pancreas and also your liver functions to make sure that there is no inflammation of the pancreas, liver or gallbladder.  Gastritis, Adult Gastritis is soreness and swelling (inflammation) of the lining of the stomach. Gastritis can develop as a sudden onset (acute) or long-term (chronic) condition. If gastritis is not treated, it can lead to stomach bleeding and ulcers. CAUSES  Gastritis occurs when the stomach lining is weak or damaged. Digestive juices from the stomach then inflame the weakened stomach lining. The stomach lining may be weak or damaged due to viral or bacterial infections. One common bacterial infection is the Helicobacter pylori infection. Gastritis can also result from excessive alcohol consumption, taking certain medicines, or having too much acid in the stomach.  SYMPTOMS  In some cases, there are no symptoms. When symptoms are present, they may include:  Pain or a burning sensation in the upper abdomen.  Nausea.  Vomiting.  An uncomfortable feeling of fullness after eating. DIAGNOSIS  Your caregiver may suspect you have gastritis based on your symptoms and a physical exam. To determine the cause of your gastritis, your caregiver may perform the following:  Blood or stool tests to check for the H pylori bacterium.  Gastroscopy. A thin, flexible tube (endoscope) is passed down the esophagus and into the stomach. The endoscope has a light and camera on the end. Your caregiver uses the endoscope to view the inside of the stomach.  Taking a tissue sample (biopsy) from the stomach to examine under a microscope. TREATMENT  Depending on the cause of your gastritis, medicines may be prescribed. If you have a bacterial infection,  such as an H pylori infection, antibiotics may be given. If your gastritis is caused by too much acid in the stomach, H2 blockers or antacids may be given. Your caregiver may recommend that you stop taking aspirin, ibuprofen, or other nonsteroidal anti-inflammatory drugs (NSAIDs). HOME CARE INSTRUCTIONS  Only take over-the-counter or prescription medicines as directed by your caregiver.  If you were given antibiotic medicines, take them as directed. Finish them even if you start to feel better.  Drink enough fluids to keep your urine clear or pale yellow.  Avoid foods and drinks that make your symptoms worse, such as:  Caffeine or alcoholic drinks.  Chocolate.  Peppermint or mint flavorings.  Garlic and onions.  Spicy foods.  Citrus fruits, such as oranges, lemons, or limes.  Tomato-based foods such as sauce, chili, salsa, and pizza.  Fried and fatty foods.  Eat small, frequent meals instead of large meals. SEEK IMMEDIATE MEDICAL CARE IF:   You have black or dark red stools.  You vomit blood or material that looks like coffee grounds.  You are unable to keep fluids down.  Your abdominal pain gets worse.  You have a fever.  You do not feel better after 1 week.  You have any other questions or concerns. MAKE SURE YOU:  Understand these instructions.  Will watch your condition.  Will get help right away if you are not doing well or get worse. Document Released: 04/21/2001 Document Revised: 10/27/2011 Document Reviewed: 06/10/2011 [facility] Patient Information 2015 [place], [place]. This information is not intended to replace advice  given to you by your health care provider. Make sure you discuss any questions you have with your health care provider.  

## 2015-02-12 NOTE — Progress Notes (Signed)
   Subjective:    Patient ID: Mike Kramer, male    DOB: 01/05/1971, 44 y.o.   MRN: [id number]  Abdominal Pain Pertinent negatives include no constipation, diarrhea, fever, nausea or vomiting.   Patient presents to the office for evaluation of epigastric right sided burning abdominal pain x 1 month.  Pain is there pretty much constantly but it does flare up as a sharp sensation intermittently.  It can be sensitive to the touch.  It does not appear to have any relationship to eating and drinking.  He reports that it does not hurt when he is moving.  He does have pain with coughing and sneezing.  He does not do a lot of coughing or sneezing.  He has had no changes in activity.  He reports that there was no initial injury.  He reports that he has never had injuries or surgeries on the abdomen in the past.  He does find that pressure will ease the pain slightly.  He reports that it is fairly disruptive to his day.   He reports that the change in medications has had no effect on the pain level.      Review of Systems  Constitutional: Negative for fever and chills.  Respiratory: Negative for chest tightness and shortness of breath.   Gastrointestinal: Positive for abdominal pain. Negative for nausea, vomiting, diarrhea, constipation, blood in stool and anal bleeding.       Objective:   Physical Exam  Constitutional: He is oriented to person, place, and time. He appears well-developed and well-nourished. No distress.  HENT:  Head: Normocephalic.  Mouth/Throat: Oropharynx is clear and moist. No oropharyngeal exudate.  Eyes: Conjunctivae are normal. No scleral icterus.  Neck: Normal range of motion. Neck supple. No JVD present. No thyromegaly present.  Cardiovascular: Normal rate, regular rhythm, normal heart sounds and intact distal pulses.  Exam reveals no gallop and no friction rub.   No murmur heard. Pulmonary/Chest: Effort normal and breath sounds normal. No respiratory distress. He has no  wheezes. He has no rales. He exhibits no tenderness.  Abdominal: Soft. Bowel sounds are normal. He exhibits no distension and no mass. There is tenderness (minimal tenderness to palpation.  No palpable abdominal wall abnormalties) in the epigastric area. There is CVA tenderness. There is no rigidity, no rebound, no guarding, no tenderness at McBurney's point and negative Murphy's sign. No hernia.  Musculoskeletal: Normal range of motion.  Lymphadenopathy:    He has no cervical adenopathy.  Neurological: He is alert and oriented to person, place, and time.  Skin: Skin is warm and dry. He is not diaphoretic.  Psychiatric: He has a normal mood and affect. His behavior is normal. Judgment and thought content normal.  Nursing note and vitals reviewed.         Assessment & Plan:    1. Abdominal pain, epigastric -ddx includes gastritis, PUD especially given recent use of mobic x 60 days, hiatal hernia, abdominal wall defect, pancreatitis, cholelithiasis.  Will check CMET and Lipase today and try a two week trial of PPI and carafate.  Recheck in 2 weeks and discuss further imaging if no improvement.    - Lipase - Comprehensive metabolic panel

## 2015-02-20 ENCOUNTER — Ambulatory Visit: Payer: Self-pay | Admitting: Internal Medicine

## 2015-02-21 ENCOUNTER — Encounter: Payer: Self-pay | Admitting: Internal Medicine

## 2015-02-22 ENCOUNTER — Other Ambulatory Visit: Payer: Self-pay | Admitting: Internal Medicine

## 2015-02-22 DIAGNOSIS — R1031 Right lower quadrant pain: Secondary | ICD-10-CM

## 2015-02-27 ENCOUNTER — Ambulatory Visit: Payer: Self-pay | Admitting: Internal Medicine

## 2015-02-28 ENCOUNTER — Encounter: Payer: Self-pay | Admitting: Internal Medicine

## 2015-02-28 ENCOUNTER — Ambulatory Visit (INDEPENDENT_AMBULATORY_CARE_PROVIDER_SITE_OTHER): Payer: BLUE CROSS/BLUE SHIELD | Admitting: Internal Medicine

## 2015-02-28 VITALS — BP 114/76 | HR 60 | Temp 97.5°F | Resp 16 | Ht 70.5 in | Wt 291.0 lb

## 2015-02-28 DIAGNOSIS — R079 Chest pain, unspecified: Secondary | ICD-10-CM

## 2015-02-28 DIAGNOSIS — R109 Unspecified abdominal pain: Secondary | ICD-10-CM | POA: Diagnosis not present

## 2015-02-28 DIAGNOSIS — I10 Essential (primary) hypertension: Secondary | ICD-10-CM

## 2015-02-28 MED ORDER — PREDNISONE 20 MG PO TABS: ORAL_TABLET | ORAL | Status: AC

## 2015-02-28 NOTE — Progress Notes (Signed)
   Subjective:    Patient ID: Mike Kramer, male    DOB: 01/10/1971, 44 y.o.   MRN: [id number]  HPI This nice 44 yo WM returns for 1 month f/u after starting Ziac for BP and Sertraline for mood/adjustment disorder. BP is Nl & he reports he feels calmer, less moody, irritable & more relaxed. Labs from 1 month ago did reveal very high Chol/TG's with initial plan to try diet 1st. Testosterone was very low at 204 as was Vit D at 24. Patient has also been seen in the interim for ongoing Rt lower chest pains as well Rt abdominal pains which are not exertional nor related to food intake or correlated with other GI sx's as HB, reflux, bloating or evacuation. No assoc N/V.  Rates the pain as up to 10/10 in severity, aching in quality, multiple episodes thru out the day sometimes lasting up to 10 min. May be a mild positional component  with bending forward while crouching and relieved with laying prone.   Medication Sig  . bisoprolol-hydrochlorothiazide (ZIAC) 5-6.25 MG per tablet Take 1 tablet daily for BP  . omeprazole (PRILOSEC) 40 MG capsule Take 1 capsule (40 mg total) by mouth daily.  . sertraline (ZOLOFT) 50 MG tablet Take 1 tablet (50 mg total) by mouth daily.  . sucralfate (CARAFATE) 1 G tablet Take 1 tablet (1 g total) by mouth 4 (four) times daily -  with meals and at bedtime. (Patient not taking: Reported on 02/28/2015)   Allergies  Allergen Reactions  . Penicillins Hives    Childhood allergy   No past medical history on file.   Review of Systems 10 point systems review negative except as above.    Objective:   Physical Exam  BP 114/76 mmHg  Pulse 60  Temp(Src) 97.5 F (36.4 C)  Resp 16  Ht 5' 10.5" (1.791 m)  Wt 291 lb (131.997 kg)  BMI 41.15 kg/m2  HEENT - Eac's patent. TM's Nl. EOM's full. PERRLA. NasoOroPharynx clear. Neck - supple. Nl Thyroid. Carotids 2+ & No bruits, nodes, JVD Chest - Clear equal BS w/o Rales, rhonchi, wheezes.Moderately tender over lower anteriorlateral  Right chest. Cor - Nl HS. RRR w/o sig MGR. PP 1(+). No edema. Abd - No palpable organomegaly, masses with tenderness of the Right abdomen with light pressure. Sgdd. BS nl. MS- FROM w/o deformities. Muscle power, tone and bulk Nl. Gait Nl. Neuro - No obvious Cr N abnormalities. Sensory, motor and Cerebellar functions appear Nl w/o focal abnormalities. Psyche - Mental status normal & appropriate.  No delusions, ideations or obvious mood abnormalities.    Assessment & Plan:   1. Essential hypertension   2. Chest pain, unspecified chest pain type  - predniSONE (DELTASONE) 20 MG tablet; 1 tab 3 x day for 3 days, then 1 tab 2 x day for 3 days, then 1 tab 1 x day for 5 days  Dispense: 20 tablet; Refill: 0  3. Abdominal wall pain  - predniSONE (DELTASONE) 20 MG tablet; 1 tab 3 x day for 3 days, then 1 tab 2 x day for 3 days, then 1 tab 1 x day for 5 days  Dispense: 20 tablet; Refill: 0  - Recc try heating pad    - Patient is scheduled for an Abd  CT scan in 4 days.   Pending response to steroids 7 results of CT, then address issues of lipids, low T and Vit D.

## 2015-03-04 ENCOUNTER — Ambulatory Visit
Admission: RE | Admit: 2015-03-04 | Discharge: 2015-03-04 | Disposition: A | Discharge status: Home / Self Care | Payer: BLUE CROSS/BLUE SHIELD | Source: Ambulatory Visit | Attending: Internal Medicine | Admitting: Internal Medicine

## 2015-03-04 DIAGNOSIS — R1031 Right lower quadrant pain: Secondary | ICD-10-CM

## 2015-03-04 MED ORDER — IOPAMIDOL (ISOVUE-300) INJECTION 61%
125.0000 mL | Freq: Once | INTRAVENOUS | Status: AC | PRN
  Administered 2015-03-04: 125 mL via INTRAVENOUS

## 2015-04-07 ENCOUNTER — Other Ambulatory Visit: Payer: Self-pay | Admitting: Internal Medicine

## 2015-05-12 HISTORY — PX: ORIF FINGER FRACTURE: SHX2122

## 2015-06-06 ENCOUNTER — Ambulatory Visit (INDEPENDENT_AMBULATORY_CARE_PROVIDER_SITE_OTHER): Payer: BLUE CROSS/BLUE SHIELD | Admitting: Internal Medicine

## 2015-06-06 ENCOUNTER — Encounter: Payer: Self-pay | Admitting: Internal Medicine

## 2015-06-06 VITALS — BP 122/80 | HR 52 | Temp 97.5°F | Resp 16 | Ht 70.5 in | Wt 296.8 lb

## 2015-06-06 DIAGNOSIS — F32A Depression, unspecified: Secondary | ICD-10-CM

## 2015-06-06 DIAGNOSIS — E785 Hyperlipidemia, unspecified: Secondary | ICD-10-CM | POA: Diagnosis not present

## 2015-06-06 DIAGNOSIS — E559 Vitamin D deficiency, unspecified: Secondary | ICD-10-CM | POA: Diagnosis not present

## 2015-06-06 DIAGNOSIS — Z79899 Other long term (current) drug therapy: Secondary | ICD-10-CM | POA: Diagnosis not present

## 2015-06-06 DIAGNOSIS — F329 Major depressive disorder, single episode, unspecified: Secondary | ICD-10-CM | POA: Diagnosis not present

## 2015-06-06 DIAGNOSIS — Z131 Encounter for screening for diabetes mellitus: Secondary | ICD-10-CM

## 2015-06-06 DIAGNOSIS — I10 Essential (primary) hypertension: Secondary | ICD-10-CM | POA: Diagnosis not present

## 2015-06-06 LAB — LIPID PANEL
Cholesterol: 221 mg/dL — ABNORMAL HIGH (ref 125–200)
HDL: 34 mg/dL — ABNORMAL LOW (ref >=40)
LDL Cholesterol: 151 mg/dL — ABNORMAL HIGH (ref <130)
Total CHOL/HDL Ratio: 6.5 Ratio — ABNORMAL HIGH (ref <=5.0)
Triglycerides: 182 mg/dL — ABNORMAL HIGH (ref <150)
VLDL: 36 mg/dL — ABNORMAL HIGH (ref <30)

## 2015-06-06 LAB — TSH: TSH: 1.775 u[IU]/mL (ref 0.350–4.500)

## 2015-06-06 LAB — CBC WITH DIFFERENTIAL/PLATELET
Basophils Absolute: 0.1 10*3/uL (ref 0.0–0.1)
Basophils Relative: 1 % (ref 0–1)
Eosinophils Absolute: 0.2 10*3/uL (ref 0.0–0.7)
Eosinophils Relative: 4 % (ref 0–5)
HCT: 43.6 % (ref 39.0–52.0)
Hemoglobin: 14.5 g/dL (ref 13.0–17.0)
Lymphocytes Relative: 27 % (ref 12–46)
Lymphs Abs: 1.7 10*3/uL (ref 0.7–4.0)
MCH: 30.1 pg (ref 26.0–34.0)
MCHC: 33.3 g/dL (ref 30.0–36.0)
MCV: 90.5 fL (ref 78.0–100.0)
MPV: 10 fL (ref 8.6–12.4)
Monocytes Absolute: 0.5 10*3/uL (ref 0.1–1.0)
Monocytes Relative: 8 % (ref 3–12)
Neutro Abs: 3.7 10*3/uL (ref 1.7–7.7)
Neutrophils Relative %: 60 % (ref 43–77)
Platelets: 212 10*3/uL (ref 150–400)
RBC: 4.82 MIL/uL (ref 4.22–5.81)
RDW: 13.8 % (ref 11.5–15.5)
WBC: 6.2 10*3/uL (ref 4.0–10.5)

## 2015-06-06 LAB — BASIC METABOLIC PANEL WITH GFR
BUN: 13 mg/dL (ref 7–25)
CO2: 25 mmol/L (ref 20–31)
Calcium: 9.4 mg/dL (ref 8.6–10.3)
Chloride: 104 mmol/L (ref 98–110)
Creat: 0.9 mg/dL (ref 0.60–1.35)
GFR, Est African American: >89 mL/min (ref >=60)
GFR, Est Non African American: >89 mL/min (ref >=60)
Glucose, Bld: 69 mg/dL (ref 65–99)
Potassium: 4 mmol/L (ref 3.5–5.3)
Sodium: 140 mmol/L (ref 135–146)

## 2015-06-06 LAB — MAGNESIUM: Magnesium: 2.1 mg/dL (ref 1.5–2.5)

## 2015-06-06 LAB — HEPATIC FUNCTION PANEL
ALT: 28 U/L (ref 9–46)
AST: 23 U/L (ref 10–40)
Albumin: 4 g/dL (ref 3.6–5.1)
Alkaline Phosphatase: 65 U/L (ref 40–115)
Bilirubin, Direct: 0.1 mg/dL (ref <=0.2)
Indirect Bilirubin: 0.6 mg/dL (ref 0.2–1.2)
Total Bilirubin: 0.7 mg/dL (ref 0.2–1.2)
Total Protein: 6.6 g/dL (ref 6.1–8.1)

## 2015-06-06 LAB — HEMOGLOBIN A1C
Hgb A1c MFr Bld: 5.7 % — ABNORMAL HIGH (ref <5.7)
Mean Plasma Glucose: 117 mg/dL — ABNORMAL HIGH (ref <117)

## 2015-06-06 IMAGING — CR DG HAND COMPLETE 3+V*R*
3 series · 3 of 3 positions shown · non-contrast
Comparison: 01/24/2008

CLINICAL DATA: Fall from a ladder 2 days ago. Hand pain and
swelling.

EXAM:
RIGHT HAND - COMPLETE 3+ VIEW

[x hand pa right]
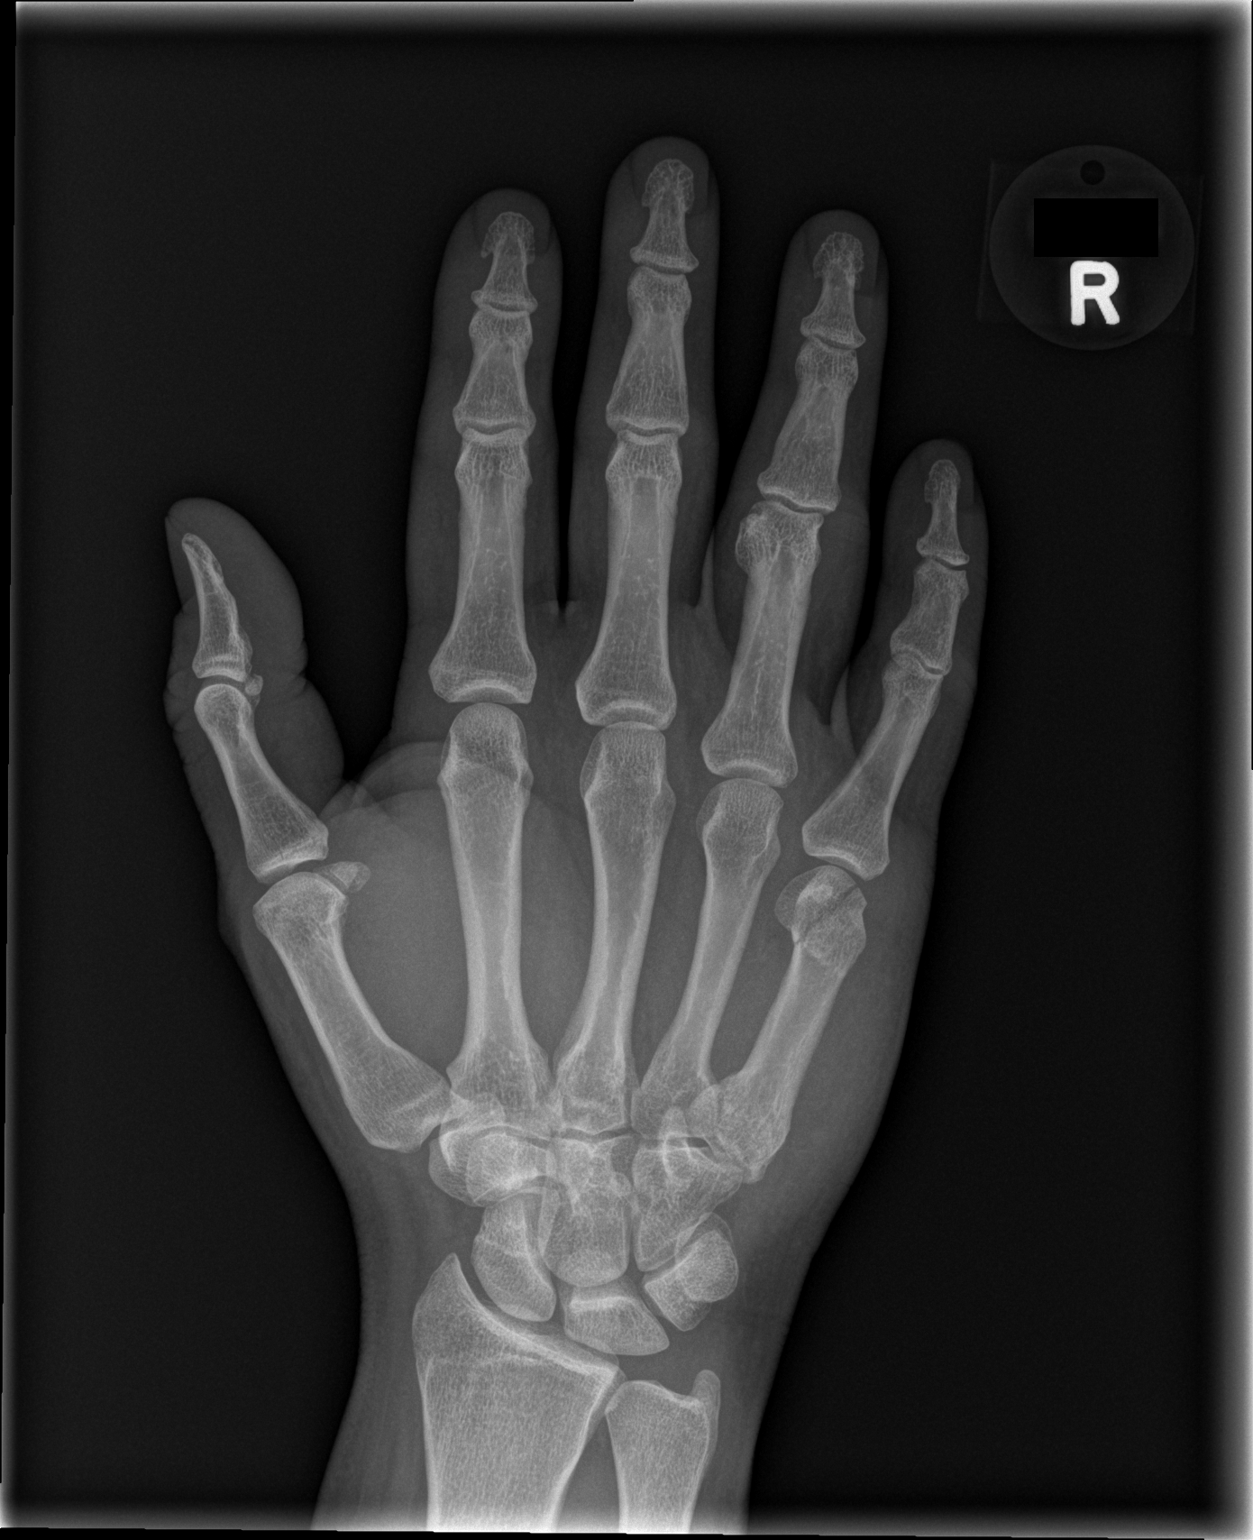

[x hand obl right]
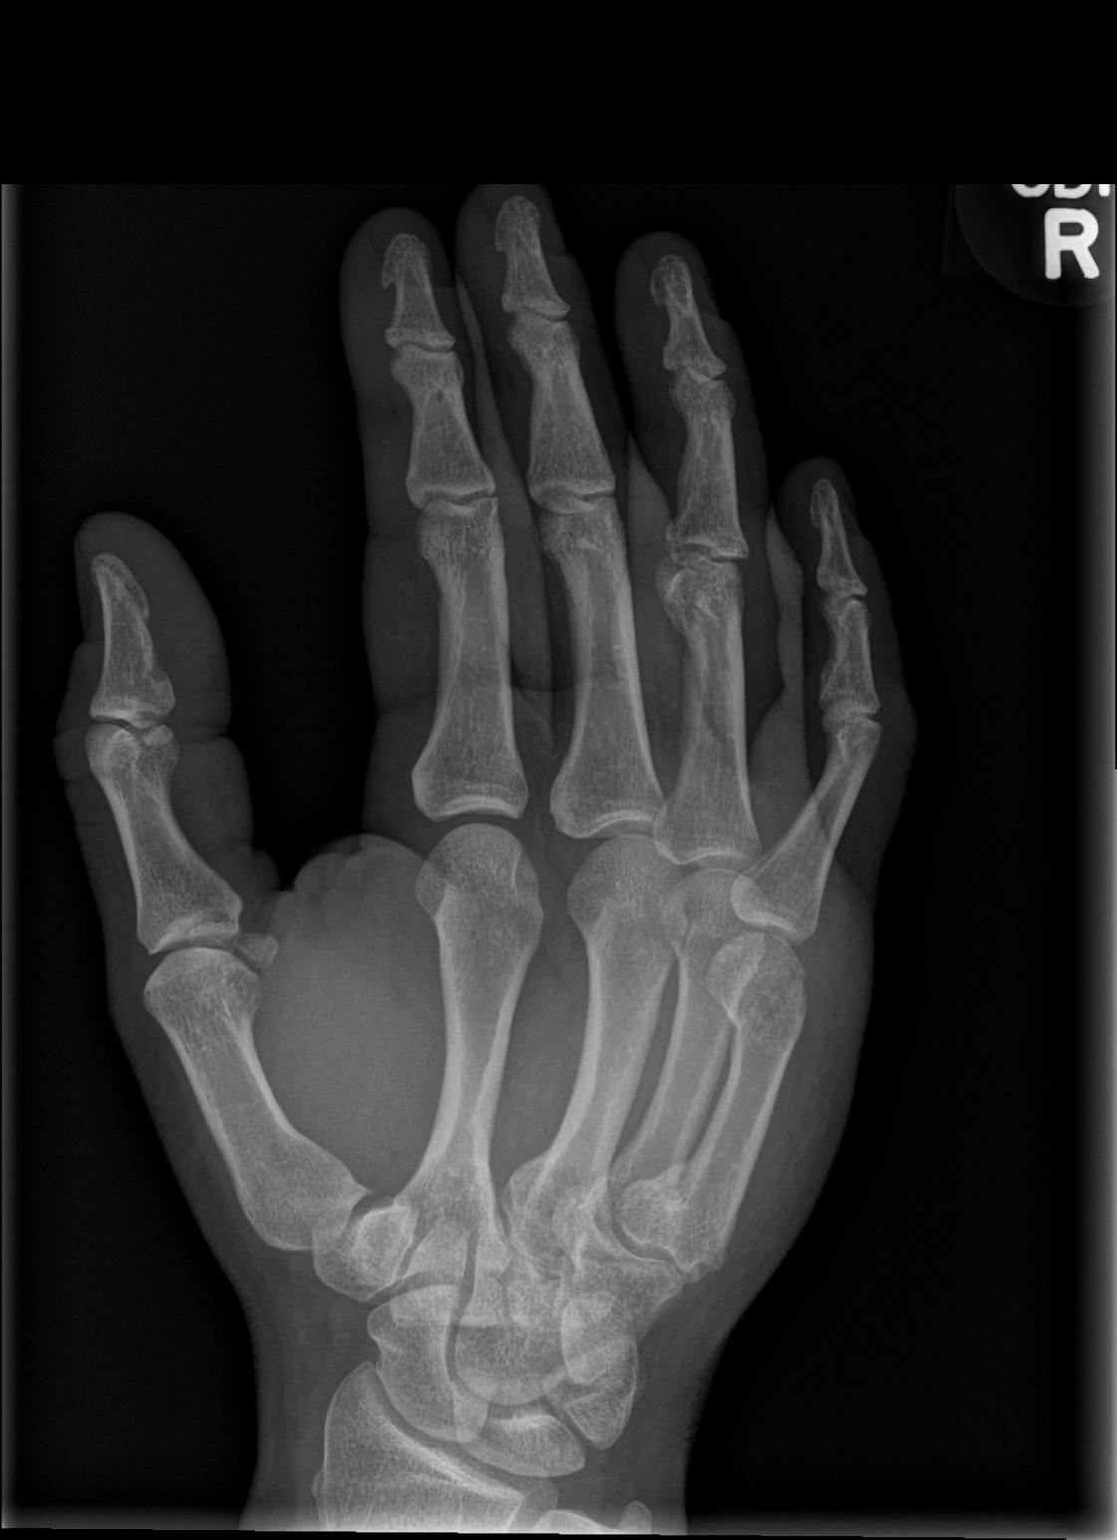

[x hand lat right]
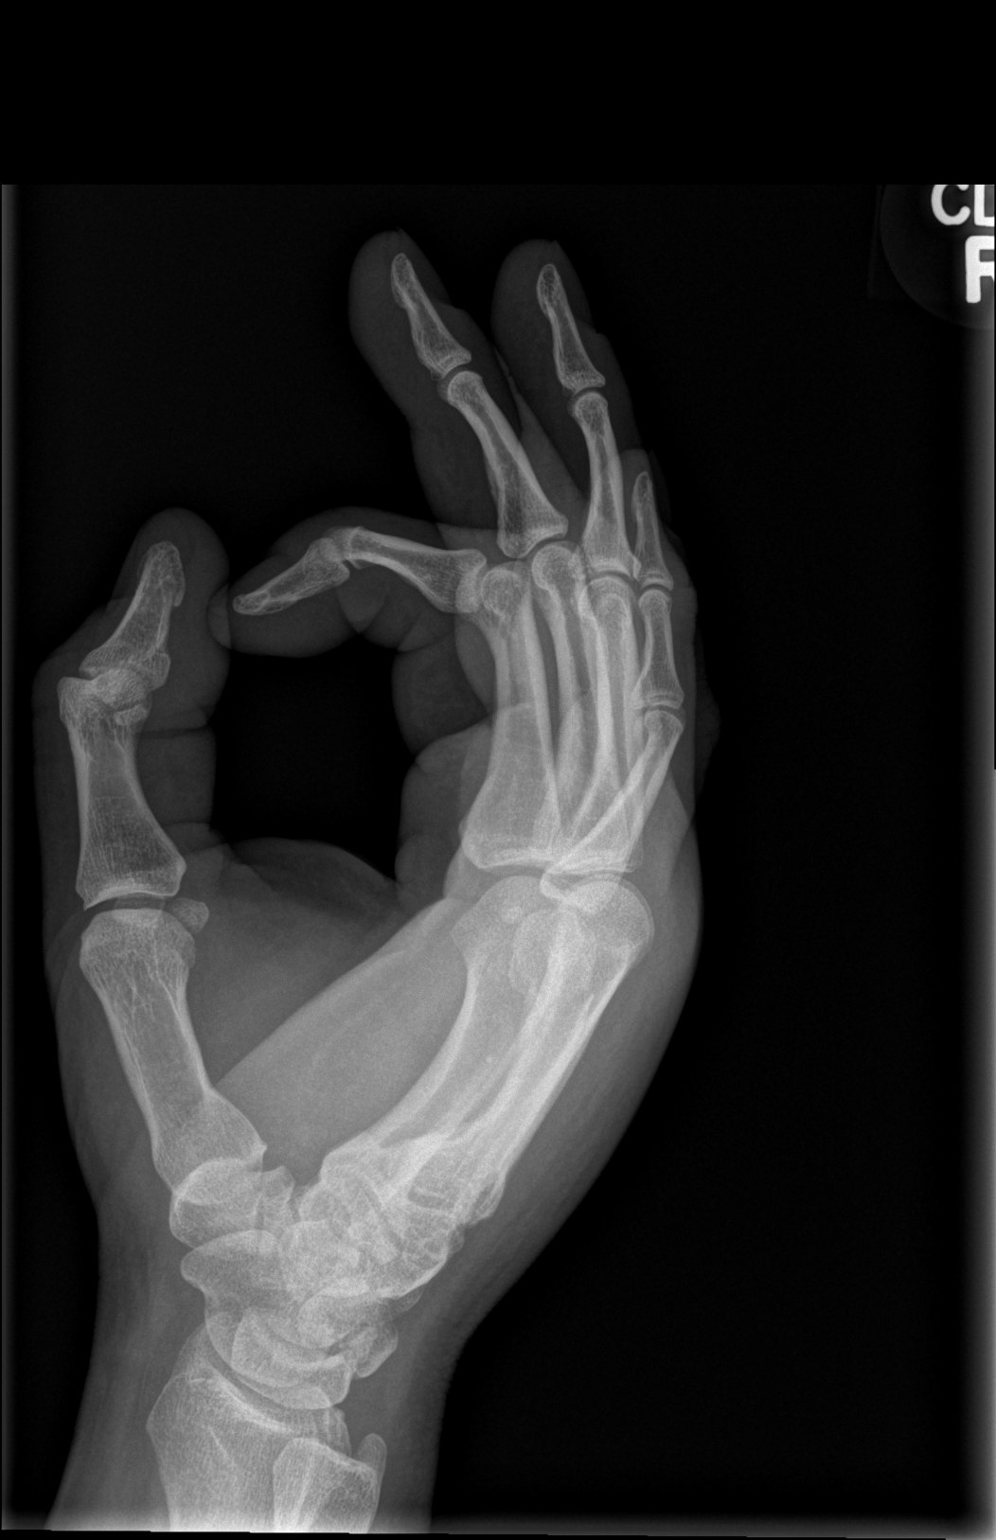

[3 of 3 positions shown; findings below may reference images not displayed]

FINDINGS: Acute fracture, distal 5th metacarpal, extending from the radial
side of the distal metaphysis to the medial distal articular surface
as shown on the frontal projection. There is apex posterior
angulation.

Deformity of the base of the 5th metatarsal is from old fracture.
Suspected accessory ossicle along the dorsum of the carpus.
IMPRESSION: 1. Distal 5th metacarpal fracture extends from the metaphysis to the
medial articular surface.
2. Deformity at the base of the 5th metacarpal is from a remote
fracture.

## 2015-06-06 MED ORDER — SERTRALINE HCL 100 MG PO TABS: 100.0000 mg | ORAL_TABLET | Freq: Every day | ORAL | Status: DC

## 2015-06-06 MED ORDER — ASPIRIN EC 81 MG PO TBEC: 81.0000 mg | DELAYED_RELEASE_TABLET | Freq: Every day | ORAL | Status: AC

## 2015-06-06 MED ORDER — VITAMIN D 1000 UNITS PO TABS: 1000.0000 [IU] | ORAL_TABLET | Freq: Every day | ORAL | Status: DC

## 2015-06-06 NOTE — Progress Notes (Signed)
Patient ID: Mike Kramer, male   DOB: 1970-11-01, 45 y.o.   MRN: [id number]   This very nice 45 y.o. MWM presents for  follow up with Hypertension, Hyperlipidemia, Pre-Diabetes and Vitamin D Deficiency. Pastienhabeen started on Sertraline lat fall and had good initial response with reduction in his anxiety and irritability, but now feels that he is backsliding and again becoming irritable, "short-fused" & anxious.    Patient is treated for HTN since 2015 & BP has been controlled and today's BP: 122/80 mmHg. Patient has had no complaints of any cardiac type chest pain, palpitations, dyspnea/orthopnea/PND, dizziness, claudication, or dependent edema.   Hyperlipidemia is not controlled with diet. Last Lipids were not at goal with Cholesterol 244*; HDL 40; LDL 161;& elevated  Triglycerides 214 on 01/23/2015.   Also, the patient has history of Morbid Obesity (BMI 41+) and  Is monitored expectantly for PreDiabetes and has had no symptoms of reactive hypoglycemia, diabetic polys, paresthesias or visual blurring.  Last A1c was  5.6% on 01/23/2015.    Further, the patient also has history of Vitamin D Deficiency of "20" in Sept 2016 and supplements vitamin D without any suspected side-effects.   Medication Sig  . bisoprolol-hctz (ZIAC) 5-6.25 MG  Take 1 tablet daily for BP   Vitamin D  ?1,000? units  1 capsule daily   bASA 81 mg   1 Tablet daily  . Omeprazole 40 MG capsule Take 1 capsule (40 mg total) by mouth daily.  . sertraline (ZOLOFT) 50 MG tablet TAKE 1 TABLET BY MOUTH EVERY DAY   Allergies  Allergen Reactions  . Penicillins Hives    Childhood allergy   PMHx:  No past medical history on file.  Past Surgical History  Procedure Laterality Date  . Knee surgery Right    FHx:    Reviewed / unchanged  SHx:    Reviewed / unchanged  Systems Review:  Constitutional: Denies fever, chills, wt changes, headaches, insomnia, fatigue, night sweats, change in appetite. Eyes: Denies redness, blurred  vision, diplopia, discharge, itchy, watery eyes.  ENT: Denies discharge, congestion, post nasal drip, epistaxis, sore throat, earache, hearing loss, dental pain, tinnitus, vertigo, sinus pain, snoring.  CV: Denies chest pain, palpitations, irregular heartbeat, syncope, dyspnea, diaphoresis, orthopnea, PND, claudication or edema. Respiratory: denies cough, dyspnea, DOE, pleurisy, hoarseness, laryngitis, wheezing.  Gastrointestinal: Denies dysphagia, odynophagia, heartburn, reflux, water brash, abdominal pain or cramps, nausea, vomiting, bloating, diarrhea, constipation, hematemesis, melena, hematochezia  or hemorrhoids. Genitourinary: Denies dysuria, frequency, urgency, nocturia, hesitancy, discharge, hematuria or flank pain. Musculoskeletal: Denies arthralgias, myalgias, stiffness, jt. swelling, pain, limping or strain/sprain.  Skin: Denies pruritus, rash, hives, warts, acne, eczema or change in skin lesion(s). Neuro: No weakness, tremor, incoordination, spasms, paresthesia or pain. Psychiatric: Denies confusion, memory loss or sensory loss. Endo: Denies change in weight, skin or hair change.  Heme/Lymph: No excessive bleeding, bruising or enlarged lymph nodes.  Physical Exam  BP 122/80 mmHg  Pulse 52  Temp(Src) 97.5 F (36.4 C)  Resp 16  Ht 5' 10.5" (1.791 m)  Wt 296 lb 12.8 oz (134.628 kg)  BMI 41.97 kg/m2  Appears well nourished and in no distress. Eyes: PERRLA, EOMs, conjunctiva no swelling or erythema. Sinuses: No frontal/maxillary tenderness ENT/Mouth: EAC's clear, TM's nl w/o erythema, bulging. Nares clear w/o erythema, swelling, exudates. Oropharynx clear without erythema or exudates. Oral hygiene is good. Tongue normal, non obstructing. Hearing intact.  Neck: Supple. Thyroid nl. Car 2+/2+ without bruits, nodes or JVD. Chest: Respirations nl with BS  clear & equal w/o rales, rhonchi, wheezing or stridor.  Cor: Heart sounds normal w/ regular rate and rhythm without sig. murmurs,  gallops, clicks, or rubs. Peripheral pulses normal and equal  without edema.  Abdomen: Soft & bowel sounds normal. Non-tender w/o guarding, rebound, hernias, masses, or organomegaly.  Lymphatics: Unremarkable.  Musculoskeletal: Full ROM all peripheral extremities, joint stability, 5/5 strength, and normal gait.  Skin: Warm, dry without exposed rashes, lesions or ecchymosis apparent.  Neuro: Cranial nerves intact, reflexes equal bilaterally. Sensory-motor testing grossly intact. Tendon reflexes grossly intact.  Pysch: Alert & oriented x 3.  Insight and judgement nl & appropriate. No ideations.  Assessment and Plan:  1. Essential hypertension  - TSH  2. Hyperlipidemia  - Lipid panel - TSH  3. Screening for diabetes mellitus (DM)  - Hemoglobin A1c - Insulin, random  4. Vitamin D deficiency  - VITAMIN D 25 Hydroxy   5. Morbid obesity (HCC)   6. Depression, controlled  - sertraline (ZOLOFT) 100 MG tablet; Take 1 tab daily.  Disp: 90 tab; Rf: 1  7. Medication management  - CBC with Differential/Platelet - BASIC METABOLIC PANEL WITH GFR - Hepatic function panel - Magnesium   Recommended regular exercise, BP monitoring, weight control, and discussed med and SE's. Recommended labs to assess and monitor clinical status. Further disposition pending results of labs. Over 30 minutes of exam, counseling, chart review was performed

## 2015-06-06 NOTE — Patient Instructions (Signed)

## 2015-06-07 ENCOUNTER — Other Ambulatory Visit: Payer: Self-pay | Admitting: Internal Medicine

## 2015-06-07 DIAGNOSIS — E785 Hyperlipidemia, unspecified: Secondary | ICD-10-CM

## 2015-06-07 LAB — VITAMIN D 25 HYDROXY (VIT D DEFICIENCY, FRACTURES): Vit D, 25-Hydroxy: 28 ng/mL — ABNORMAL LOW (ref 30–100)

## 2015-06-07 LAB — INSULIN, RANDOM: Insulin: 32.6 u[IU]/mL — ABNORMAL HIGH (ref 2.0–19.6)

## 2015-06-07 MED ORDER — ATORVASTATIN CALCIUM 80 MG PO TABS: ORAL_TABLET | ORAL | Status: DC

## 2015-07-13 ENCOUNTER — Other Ambulatory Visit: Payer: Self-pay | Admitting: Internal Medicine

## 2015-07-16 ENCOUNTER — Ambulatory Visit (INDEPENDENT_AMBULATORY_CARE_PROVIDER_SITE_OTHER): Payer: BLUE CROSS/BLUE SHIELD | Admitting: Internal Medicine

## 2015-07-16 ENCOUNTER — Encounter: Payer: Self-pay | Admitting: Internal Medicine

## 2015-07-16 VITALS — BP 112/60 | HR 62 | Temp 98.0°F | Resp 18 | Ht 70.5 in | Wt 297.0 lb

## 2015-07-16 DIAGNOSIS — S61451A Open bite of right hand, initial encounter: Secondary | ICD-10-CM | POA: Diagnosis not present

## 2015-07-16 DIAGNOSIS — W5501XA Bitten by cat, initial encounter: Secondary | ICD-10-CM

## 2015-07-16 MED ORDER — MOXIFLOXACIN HCL 400 MG PO TABS: 400.0000 mg | ORAL_TABLET | Freq: Every day | ORAL | Status: AC

## 2015-07-16 MED ORDER — MUPIROCIN 2 % EX OINT: 1.0000 "application " | TOPICAL_OINTMENT | Freq: Two times a day (BID) | CUTANEOUS | Status: DC

## 2015-07-16 NOTE — Patient Instructions (Signed)
Moxifloxacin tablets What is this medicine? MOXIFLOXACIN (mox i FLOX a sin) is a quinolone antibiotic. It is used to treat certain kinds of bacterial infections. It will not work for colds, flu, or other viral infections. This medicine may be used for other purposes; ask your health care provider or pharmacist if you have questions. What should I tell my health care provider before I take this medicine? They need to know if you have any of these conditions: -bone problems -cerebral disease -joint problems -history of low levels of potassium in the blood -irregular heartbeat -kidney disease -myasthenia gravis -seizures -tendon problems -tingling of the fingers or toes, or other nerve disorder -an unusual or allergic reaction to moxifloxacin, other quinolone antibiotics, other medicines, foods, dyes, or preservatives -pregnant or try to get pregnant -breast-feeding How should I use this medicine? Take this medicine by mouth with a glass of water. It can be taken with or without food. Follow the directions on the prescription label. Take your medicine at regular intervals. Do not take your medicine more often than directed. Take all of your medicine as directed even if you think your are better. Do not skip doses or stop your medicine early. A special MedGuide will be given to you by the pharmacist with each prescription and refill. Be sure to read this information carefully each time. Talk to your pediatrician regarding the use of this medicine in children. Special care may be needed. Overdosage: If you think you have taken too much of this medicine contact a poison control center or emergency room at once. NOTE: This medicine is only for you. Do not share this medicine with others. What if I miss a dose? If you miss a dose, take it as soon as you can. If it is almost time for your next dose, take only that dose. Do not take double or extra doses. What may interact with this medicine? Do  not take this medicine with any of the following medications: -arsenic trioxide -chloroquine -cisapride -droperidol -halofantrine -pentamidine -phenothiazines like chlorpromazine, mesoridazine, prochlorperazine, thioridazine -pimozide -some medications for irregular heart rhythm like amiodarone, disopyramide, dofetilide, flecainide, ibutilide, quinidine, procainamide, sotalol -ziprasidone This medicine may also interact with the following medications: -antacids -birth control pills -didanosine (ddI) buffered tablets or powder -erythromycin -medicines for inflammation like ibuprofen, naproxen -vitamins with iron or zinc -medicines for depression, anxiety, or psychotic disturbances -sucralfate -warfarin This list may not describe all possible interactions. Give your health care provider a list of all the medicines, herbs, non-prescription drugs, or dietary supplements you use. Also tell them if you smoke, drink alcohol, or use illegal drugs. Some items may interact with your medicine. What should I watch for while using this medicine? Tell your doctor or health care professional if your symptoms do not improve. Do not treat diarrhea with over the counter products. Contact your doctor if you have diarrhea that lasts more than 2 days or if it is severe and watery. If you have diabetes, monitor your blood glucose carefully while on this medicine. You may get drowsy or dizzy. Do not drive, use machinery, or do anything that needs mental alertness until you know how this medicine affects you. Do not stand or sit up quickly, especially if you are an older patient. This reduces the risk of dizzy or fainting spells. This medicine can make you more sensitive to the sun. Keep out of the sun. If you cannot avoid being in the sun, wear protective clothing and use sunscreen. Do not  use sun lamps or tanning beds/booths. Avoid antacids, aluminum, calcium, iron, magnesium, and zinc products for 4 hours  before and 8 hours after taking a dose of this medicine. What side effects may I notice from receiving this medicine? Side effects that you should report to your doctor or health care professional as soon as possible: -allergic reactions like skin rash or hives, swelling of the face, lips, or tongue -anxious -confusion -depressed mood -diarrhea -fast, irregular heartbeat -hallucination, loss of contact with reality -joint, muscle, or tendon pain or swelling -pain, tingling, numbness in the hands or feet -suicidal thoughts or other mood changes -sunburn -unusually weak or tired Side effects that usually do not require medical attention (report to your doctor or health care professional if they continue or are bothersome): -dry mouth -headache -nausea -trouble sleeping This list may not describe all possible side effects. Call your doctor for medical advice about side effects. You may report side effects to FDA at 1-800-FDA-1088. Where should I keep my medicine? Keep out of the reach of children. Store at room temperature between 15 to 30 degrees C (59 to 86 degrees F). Do not store in a humid place. Throw away any unused medicine after the expiration date. NOTE: This sheet is a summary. It may not cover all possible information. If you have questions about this medicine, talk to your doctor, pharmacist, or health care provider.    2016, [employer]vier/[employer] Standard. (2014-12-06 11:57:55)

## 2015-07-16 NOTE — Progress Notes (Signed)
   Subjective:    Patient ID: Mike Kramer, male    DOB: 10/06/1970, 45 y.o.   MRN: [id number]  HPI  Patient presents to the office for evaluation of cat bite yesterday evening to the right arm.  He is right handed.  No fevers or chills, no nausea or vomiting.  He is having a lot of swelling.  He has been using ice and has also been elevating his hand. He did clear out the wound with hydrogen peroxide.    Review of Systems  Constitutional: Negative for fever, chills and fatigue.  Gastrointestinal: Negative for nausea and vomiting.  Skin: Positive for wound.       Objective:   Physical Exam  Constitutional: He is oriented to person, place, and time. He appears well-developed and well-nourished.  HENT:  Head: Normocephalic.  Mouth/Throat: Oropharynx is clear and moist. No oropharyngeal exudate.  Eyes: Conjunctivae are normal. No scleral icterus.  Neck: Normal range of motion. Neck supple. No JVD present. No thyromegaly present.  Cardiovascular: Normal rate, regular rhythm and intact distal pulses.  Exam reveals no gallop and no friction rub.   No murmur heard. Pulmonary/Chest: Effort normal and breath sounds normal. No respiratory distress. He has no wheezes. He has no rales. He exhibits no tenderness.  Abdominal: Soft. Bowel sounds are normal. He exhibits no distension and no mass. There is no tenderness. There is no rebound and no guarding.  Musculoskeletal: Normal range of motion.       Arms: Neurovascularly intact in the radial, median, and ulnar distribution.  Able to perform full flexion and extension with mild resistance.    Lymphadenopathy:    He has no cervical adenopathy.  Neurological: He is alert and oriented to person, place, and time.  Skin: Skin is warm and dry.     Psychiatric: He has a normal mood and affect. His behavior is normal. Judgment and thought content normal.  Nursing note and vitals reviewed.   Filed Vitals:   07/16/15 1100  BP: 112/60  Pulse: 62    Temp: 98 F (36.7 C)  Resp: 18         Assessment & Plan:    1. Cat bite, initial encounter -warm water soaks -bactroban on wounds -elevate above heart -ice - moxifloxacin (AVELOX) 400 MG tablet; Take 1 tablet (400 mg total) by mouth daily.  Dispense: 14 tablet; Refill: 0 -recheck in 3 days

## 2015-07-19 ENCOUNTER — Ambulatory Visit (INDEPENDENT_AMBULATORY_CARE_PROVIDER_SITE_OTHER): Payer: BLUE CROSS/BLUE SHIELD | Admitting: Internal Medicine

## 2015-07-19 ENCOUNTER — Encounter: Payer: Self-pay | Admitting: Internal Medicine

## 2015-07-19 VITALS — BP 116/60 | HR 62 | Temp 98.2°F | Resp 18 | Ht 70.5 in | Wt 294.0 lb

## 2015-07-19 DIAGNOSIS — W5501XD Bitten by cat, subsequent encounter: Secondary | ICD-10-CM

## 2015-07-19 DIAGNOSIS — S61451D Open bite of right hand, subsequent encounter: Secondary | ICD-10-CM

## 2015-07-19 NOTE — Progress Notes (Signed)
  Assessment and Plan:   1. Cat bite, subsequent encounter -improved -cont abx -warm water soaks    HPI 45 y.o.male presents for 2 day follow up of cat bite on avelox. Patient reports that they have been doing well.  male is taking their medication.  They are not having difficulty with their medications.  They report no adverse reactions.  No fevers, chills, nausea, vomiting, increased redness.  Swelling is improved.  No past medical history on file.   Allergies  Allergen Reactions  . Penicillins Hives    Childhood allergy      Current Outpatient Prescriptions on File Prior to Visit  Medication Sig Dispense Refill  . aspirin EC 81 MG tablet Take 1 tablet (81 mg total) by mouth daily.    Marland Kitchen. atorvastatin (LIPITOR) 80 MG tablet Take 1/2 to 1 tablet daily or as directed for Cholesterol 90 tablet 1  . bisoprolol-hydrochlorothiazide (ZIAC) 5-6.25 MG tablet TAKE 1 TABLET BY MOUTH EVERY DAY FOR BLOOD PRESSURE 90 tablet 1  . cholecalciferol (VITAMIN D) 1000 units tablet Take 1 tablet (1,000 Units total) by mouth daily.    Marland Kitchen. moxifloxacin (AVELOX) 400 MG tablet Take 1 tablet (400 mg total) by mouth daily. 14 tablet 0  . mupirocin ointment (BACTROBAN) 2 % Place 1 application into the nose 2 (two) times daily. 22 g 0  . sertraline (ZOLOFT) 100 MG tablet Take 1 tablet (100 mg total) by mouth daily. 90 tablet 1   No current facility-administered medications on file prior to visit.    ROS: all negative except above.   Physical Exam: There were no vitals filed for this visit. Ht 5' 10.5" (1.791 m) General Appearance: Well developed well nourished, non-toxic appearing in no apparent distress. Eyes: PERRLA, EOMs, conjunctiva w/ no swelling or erythema or discharge Sinuses: No Frontal/maxillary tenderness ENT/Mouth: Ear canals clear without swelling or erythema.  TM's normal bilaterally with no retractions, bulging, or loss of landmarks.   Neck: Supple, thyroid normal, no notable JVD   Respiratory: Respiratory effort normal, Clear breath sounds anteriorly and posteriorly bilaterally without rales, rhonchi, wheezing or stridor. No retractions or accessory muscle usage. Cardio: RRR with no MRGs.   Abdomen: Soft, + BS.  Non tender, no guarding, rebound, hernias, masses.  Musculoskeletal: Full ROM, 5/5 strength, normal gait.  Skin: Multiple superficial scratches to the right arm which are healing well.  4 puncture wounds to the right hand with mild swelling. Puncture wound near the 1st MCP with some surrounding erythema.  No discharge present  NO other redness, no discharge.    Neuro: Awake and oriented X 3, Cranial nerves intact. Normal muscle tone, no cerebellar symptoms. Sensation intact.  Psych: normal affect, Insight and Judgment appropriate.     Terri Piedraourtney Forcucci, PA-C 10:45 AM [facility]

## 2015-07-24 ENCOUNTER — Encounter: Payer: Self-pay | Admitting: Internal Medicine

## 2015-07-29 ENCOUNTER — Other Ambulatory Visit: Payer: Self-pay | Admitting: Internal Medicine

## 2015-07-29 DIAGNOSIS — M79643 Pain in unspecified hand: Secondary | ICD-10-CM

## 2015-09-04 ENCOUNTER — Ambulatory Visit: Payer: Self-pay | Admitting: Internal Medicine

## 2015-09-12 ENCOUNTER — Ambulatory Visit: Payer: Self-pay | Admitting: Internal Medicine

## 2015-09-19 ENCOUNTER — Encounter: Payer: Self-pay | Admitting: Internal Medicine

## 2015-09-19 ENCOUNTER — Ambulatory Visit (INDEPENDENT_AMBULATORY_CARE_PROVIDER_SITE_OTHER): Payer: BLUE CROSS/BLUE SHIELD | Admitting: Internal Medicine

## 2015-09-19 VITALS — BP 124/70 | HR 58 | Temp 98.2°F | Resp 18 | Ht 70.5 in | Wt 296.0 lb

## 2015-09-19 DIAGNOSIS — Z79899 Other long term (current) drug therapy: Secondary | ICD-10-CM

## 2015-09-19 DIAGNOSIS — E559 Vitamin D deficiency, unspecified: Secondary | ICD-10-CM

## 2015-09-19 DIAGNOSIS — E785 Hyperlipidemia, unspecified: Secondary | ICD-10-CM

## 2015-09-19 DIAGNOSIS — R7303 Prediabetes: Secondary | ICD-10-CM

## 2015-09-19 DIAGNOSIS — I10 Essential (primary) hypertension: Secondary | ICD-10-CM | POA: Diagnosis not present

## 2015-09-19 LAB — CBC WITH DIFFERENTIAL/PLATELET
Basophils Absolute: 84 cells/uL (ref 0–200)
Basophils Relative: 1 %
Eosinophils Absolute: 252 cells/uL (ref 15–500)
Eosinophils Relative: 3 %
HCT: 42.8 % (ref 38.5–50.0)
Hemoglobin: 14.4 g/dL (ref 13.2–17.1)
Lymphocytes Relative: 28 %
Lymphs Abs: 2352 cells/uL (ref 850–3900)
MCH: 30.1 pg (ref 27.0–33.0)
MCHC: 33.6 g/dL (ref 32.0–36.0)
MCV: 89.4 fL (ref 80.0–100.0)
MPV: 10.1 fL (ref 7.5–12.5)
Monocytes Absolute: 672 cells/uL (ref 200–950)
Monocytes Relative: 8 %
Neutro Abs: 5040 cells/uL (ref 1500–7800)
Neutrophils Relative %: 60 %
Platelets: 233 10*3/uL (ref 140–400)
RBC: 4.79 MIL/uL (ref 4.20–5.80)
RDW: 14 % (ref 11.0–15.0)
WBC: 8.4 10*3/uL (ref 3.8–10.8)

## 2015-09-19 NOTE — Progress Notes (Addendum)
Assessment and Plan:  Hypertension:  -Continue medication,  -monitor blood pressure at home.  -Continue DASH diet.   -Reminder to go to the ER if any CP, SOB, nausea, dizziness, severe HA, changes vision/speech, left arm numbness and tingling, and jaw pain.  Cholesterol: -Continue diet and exercise.  -Check cholesterol.   Pre-diabetes: -Continue diet and exercise.  -Check A1C  Vitamin D Def: -check level -continue medications.   Continue diet and meds as discussed. Further disposition pending results of labs.  HPI 45 y.o. male  presents for 3 month follow up with hypertension, hyperlipidemia, prediabetes and vitamin D.   His blood pressure has been controlled at home, today their BP is BP: 124/70 mmHg.   He does not workout. He denies chest pain, shortness of breath, dizziness.   He is on cholesterol medication and denies myalgias. His cholesterol is not at goal. The cholesterol last visit was:   Lab Results  Component Value Date   CHOL 221* 06/06/2015   HDL 34* 06/06/2015   LDLCALC 151* 06/06/2015   TRIG 182* 06/06/2015   CHOLHDL 6.5* 06/06/2015     He has been working on diet and exercise for prediabetes, and denies foot ulcerations, hyperglycemia, hypoglycemia , increased appetite, nausea, paresthesia of the feet, polydipsia, polyuria, visual disturbances, vomiting and weight loss. Last A1C in the office was:  Lab Results  Component Value Date   HGBA1C 5.7* 06/06/2015    Patient is on Vitamin D supplement.  Lab Results  Component Value Date   VD25OH 6828* 06/06/2015     Patient reports that he is going to go on a cruise to the [place] this summer and was wondering about whether he can get some travel medications as he has never been on a cruise before.    He does note that he is getting ready to do his apnea link study for sleep apnea.  He is doing this for excessive day time sleepiness, snoring, and witnessed gasping per his wife.      Current Medications:   Current Outpatient Prescriptions on File Prior to Visit  Medication Sig Dispense Refill  . aspirin EC 81 MG tablet Take 1 tablet (81 mg total) by mouth daily.    Marland Kitchen. atorvastatin (LIPITOR) 80 MG tablet Take 1/2 to 1 tablet daily or as directed for Cholesterol 90 tablet 1  . bisoprolol-hydrochlorothiazide (ZIAC) 5-6.25 MG tablet TAKE 1 TABLET BY MOUTH EVERY DAY FOR BLOOD PRESSURE 90 tablet 1  . cholecalciferol (VITAMIN D) 1000 units tablet Take 1 tablet (1,000 Units total) by mouth daily.    . sertraline (ZOLOFT) 100 MG tablet Take 1 tablet (100 mg total) by mouth daily. 90 tablet 1   No current facility-administered medications on file prior to visit.    Medical History: No past medical history on file.  Allergies:  Allergies  Allergen Reactions  . Penicillins Hives    Childhood allergy     Review of Systems:  Review of Systems  Constitutional: Negative for fever, chills and malaise/fatigue.  HENT: Negative for congestion, ear pain and sore throat.   Respiratory: Negative for cough, shortness of breath and wheezing.   Cardiovascular: Negative for chest pain, palpitations and leg swelling.  Gastrointestinal: Positive for nausea. Negative for heartburn, abdominal pain, diarrhea, constipation, blood in stool and melena.  Genitourinary: Negative.   Neurological: Negative for dizziness, loss of consciousness and headaches.  Psychiatric/Behavioral: Negative for depression. The patient is not nervous/anxious and does not have insomnia.     Family  history- Review and unchanged  Social history- Review and unchanged  Physical Exam: BP 124/70 mmHg  Pulse 58  Temp(Src) 98.2 F (36.8 C) (Temporal)  Resp 18  Ht 5' 10.5" (1.791 m)  Wt 296 lb (134.265 kg)  BMI 41.86 kg/m2 Wt Readings from Last 3 Encounters:  09/19/15 296 lb (134.265 kg)  07/19/15 294 lb (133.358 kg)  07/16/15 297 lb (134.718 kg)    General Appearance: Well nourished well developed, in no apparent distress. Eyes:  PERRLA, EOMs, conjunctiva no swelling or erythema ENT/Mouth: Ear canals normal without obstruction, swelling, erythma, discharge.  TMs normal bilaterally.  Oropharynx moist, clear, without exudate, or postoropharyngeal swelling. Neck: Supple, thyroid normal,no cervical adenopathy  Respiratory: Respiratory effort normal, Breath sounds clear A&P without rhonchi, wheeze, or rale.  No retractions, no accessory usage. Cardio: RRR with no MRGs. Brisk peripheral pulses without edema.  Abdomen: Soft, + BS,  Non tender, no guarding, rebound, hernias, masses. Musculoskeletal: Full ROM, 5/5 strength, Normal gait Skin: Warm, dry without rashes, lesions, ecchymosis.  Neuro: Awake and oriented X 3, Cranial nerves intact. Normal muscle tone, no cerebellar symptoms. Psych: Normal affect, Insight and Judgment appropriate.    Terri Piedra, PA-C 4:06 PM [facility]

## 2015-09-20 LAB — BASIC METABOLIC PANEL WITH GFR
BUN: 16 mg/dL (ref 7–25)
CO2: 23 mmol/L (ref 20–31)
Calcium: 9.3 mg/dL (ref 8.6–10.3)
Chloride: 103 mmol/L (ref 98–110)
Creat: 0.74 mg/dL (ref 0.60–1.35)
GFR, Est African American: >89 mL/min (ref >=60)
GFR, Est Non African American: >89 mL/min (ref >=60)
Glucose, Bld: 94 mg/dL (ref 65–99)
Potassium: 4.3 mmol/L (ref 3.5–5.3)
Sodium: 139 mmol/L (ref 135–146)

## 2015-09-20 LAB — HEPATIC FUNCTION PANEL
ALT: 31 U/L (ref 9–46)
AST: 22 U/L (ref 10–40)
Albumin: 4 g/dL (ref 3.6–5.1)
Alkaline Phosphatase: 78 U/L (ref 40–115)
Bilirubin, Direct: 0.1 mg/dL (ref <=0.2)
Indirect Bilirubin: 0.4 mg/dL (ref 0.2–1.2)
Total Bilirubin: 0.5 mg/dL (ref 0.2–1.2)
Total Protein: 6.6 g/dL (ref 6.1–8.1)

## 2015-09-20 LAB — HEMOGLOBIN A1C
Hgb A1c MFr Bld: 5.6 % (ref <5.7)
Mean Plasma Glucose: 114 mg/dL

## 2015-09-20 LAB — LIPID PANEL
Cholesterol: 177 mg/dL (ref 125–200)
HDL: 35 mg/dL — ABNORMAL LOW (ref >=40)
LDL Cholesterol: 87 mg/dL (ref <130)
Total CHOL/HDL Ratio: 5.1 Ratio — ABNORMAL HIGH (ref <=5.0)
Triglycerides: 274 mg/dL — ABNORMAL HIGH (ref <150)
VLDL: 55 mg/dL — ABNORMAL HIGH (ref <30)

## 2015-09-20 LAB — TSH: TSH: 1.22 mIU/L (ref 0.40–4.50)

## 2015-09-29 ENCOUNTER — Encounter: Payer: Self-pay | Admitting: Internal Medicine

## 2015-09-30 ENCOUNTER — Other Ambulatory Visit: Payer: Self-pay | Admitting: Internal Medicine

## 2015-09-30 DIAGNOSIS — R400 Somnolence: Secondary | ICD-10-CM

## 2015-09-30 DIAGNOSIS — R0683 Snoring: Secondary | ICD-10-CM

## 2015-10-22 ENCOUNTER — Other Ambulatory Visit: Payer: Self-pay | Admitting: Internal Medicine

## 2015-10-22 ENCOUNTER — Encounter: Payer: Self-pay | Admitting: Internal Medicine

## 2015-10-22 DIAGNOSIS — G4733 Obstructive sleep apnea (adult) (pediatric): Secondary | ICD-10-CM

## 2015-10-22 MED ORDER — SCOPOLAMINE 1 MG/3DAYS TD PT72: 1.0000 | MEDICATED_PATCH | TRANSDERMAL | Status: DC

## 2015-10-22 MED ORDER — ONDANSETRON HCL 4 MG PO TABS: 4.0000 mg | ORAL_TABLET | Freq: Every day | ORAL | Status: DC | PRN

## 2015-10-22 MED ORDER — AZITHROMYCIN 250 MG PO TABS: ORAL_TABLET | ORAL | Status: AC

## 2015-10-22 MED ORDER — PREDNISONE 20 MG PO TABS: ORAL_TABLET | ORAL | Status: DC

## 2015-11-24 DIAGNOSIS — G4710 Hypersomnia, unspecified: Secondary | ICD-10-CM | POA: Diagnosis not present

## 2015-11-24 DIAGNOSIS — R0683 Snoring: Secondary | ICD-10-CM | POA: Diagnosis not present

## 2015-12-04 ENCOUNTER — Other Ambulatory Visit: Payer: Self-pay | Admitting: Internal Medicine

## 2015-12-04 DIAGNOSIS — F32A Depression, unspecified: Secondary | ICD-10-CM

## 2015-12-04 DIAGNOSIS — F329 Major depressive disorder, single episode, unspecified: Secondary | ICD-10-CM

## 2015-12-09 ENCOUNTER — Encounter: Payer: Self-pay | Admitting: Internal Medicine

## 2015-12-11 DIAGNOSIS — G4733 Obstructive sleep apnea (adult) (pediatric): Secondary | ICD-10-CM | POA: Diagnosis not present

## 2015-12-11 DIAGNOSIS — R0683 Snoring: Secondary | ICD-10-CM | POA: Diagnosis not present

## 2015-12-12 ENCOUNTER — Encounter: Payer: Self-pay | Admitting: Internal Medicine

## 2015-12-12 ENCOUNTER — Encounter: Payer: Self-pay | Admitting: Physician Assistant

## 2015-12-12 DIAGNOSIS — G4733 Obstructive sleep apnea (adult) (pediatric): Secondary | ICD-10-CM | POA: Insufficient documentation

## 2015-12-23 ENCOUNTER — Encounter: Payer: Self-pay | Admitting: *Deleted

## 2015-12-25 ENCOUNTER — Encounter: Payer: Self-pay | Admitting: Internal Medicine

## 2015-12-25 ENCOUNTER — Other Ambulatory Visit: Payer: Self-pay | Admitting: Internal Medicine

## 2015-12-25 DIAGNOSIS — G4733 Obstructive sleep apnea (adult) (pediatric): Secondary | ICD-10-CM

## 2016-01-13 ENCOUNTER — Encounter: Payer: Self-pay | Admitting: *Deleted

## 2016-01-20 ENCOUNTER — Encounter: Payer: Self-pay | Admitting: Internal Medicine

## 2016-01-23 ENCOUNTER — Other Ambulatory Visit: Payer: Self-pay | Admitting: Internal Medicine

## 2016-01-27 DIAGNOSIS — T7840XA Allergy, unspecified, initial encounter: Secondary | ICD-10-CM | POA: Diagnosis not present

## 2016-01-27 DIAGNOSIS — R21 Rash and other nonspecific skin eruption: Secondary | ICD-10-CM | POA: Diagnosis not present

## 2016-01-28 ENCOUNTER — Ambulatory Visit: Payer: Self-pay | Admitting: Internal Medicine

## 2016-02-03 ENCOUNTER — Encounter: Payer: Self-pay | Admitting: Internal Medicine

## 2016-02-04 ENCOUNTER — Telehealth: Payer: Self-pay

## 2016-02-04 NOTE — Telephone Encounter (Signed)
INFORMED PT THAT HE NEEDED TO CALL [facility] IN ORDER TO GET SET UP FOR PAP. PT STATED THAT HE WOULD CALL THEM & HE HAS THE #.

## 2016-02-10 ENCOUNTER — Encounter: Payer: Self-pay | Admitting: Internal Medicine

## 2016-02-20 ENCOUNTER — Encounter: Payer: Self-pay | Admitting: Internal Medicine

## 2016-02-20 ENCOUNTER — Ambulatory Visit (INDEPENDENT_AMBULATORY_CARE_PROVIDER_SITE_OTHER): Payer: BLUE CROSS/BLUE SHIELD | Admitting: Internal Medicine

## 2016-02-20 VITALS — BP 120/82 | HR 46 | Temp 98.2°F | Resp 18 | Ht 69.0 in | Wt 302.0 lb

## 2016-02-20 DIAGNOSIS — E782 Mixed hyperlipidemia: Secondary | ICD-10-CM

## 2016-02-20 DIAGNOSIS — G4733 Obstructive sleep apnea (adult) (pediatric): Secondary | ICD-10-CM | POA: Diagnosis not present

## 2016-02-20 DIAGNOSIS — Z23 Encounter for immunization: Secondary | ICD-10-CM | POA: Diagnosis not present

## 2016-02-20 DIAGNOSIS — Z125 Encounter for screening for malignant neoplasm of prostate: Secondary | ICD-10-CM

## 2016-02-20 DIAGNOSIS — E559 Vitamin D deficiency, unspecified: Secondary | ICD-10-CM | POA: Diagnosis not present

## 2016-02-20 DIAGNOSIS — Z79899 Other long term (current) drug therapy: Secondary | ICD-10-CM | POA: Diagnosis not present

## 2016-02-20 DIAGNOSIS — Z0000 Encounter for general adult medical examination without abnormal findings: Secondary | ICD-10-CM

## 2016-02-20 DIAGNOSIS — Z136 Encounter for screening for cardiovascular disorders: Secondary | ICD-10-CM | POA: Diagnosis not present

## 2016-02-20 DIAGNOSIS — Z131 Encounter for screening for diabetes mellitus: Secondary | ICD-10-CM

## 2016-02-20 DIAGNOSIS — Z1389 Encounter for screening for other disorder: Secondary | ICD-10-CM

## 2016-02-20 DIAGNOSIS — I10 Essential (primary) hypertension: Secondary | ICD-10-CM

## 2016-02-20 DIAGNOSIS — Z130 Encounter for screening for diseases of the blood and blood-forming organs and certain disorders involving the immune mechanism: Secondary | ICD-10-CM

## 2016-02-20 DIAGNOSIS — Z1329 Encounter for screening for other suspected endocrine disorder: Secondary | ICD-10-CM

## 2016-02-20 DIAGNOSIS — Z0001 Encounter for general adult medical examination with abnormal findings: Secondary | ICD-10-CM

## 2016-02-20 LAB — BASIC METABOLIC PANEL WITH GFR
BUN: 17 mg/dL (ref 7–25)
CO2: 23 mmol/L (ref 20–31)
Calcium: 9.6 mg/dL (ref 8.6–10.3)
Chloride: 102 mmol/L (ref 98–110)
Creat: 0.85 mg/dL (ref 0.60–1.35)
GFR, Est African American: >89 mL/min (ref >=60)
GFR, Est Non African American: >89 mL/min (ref >=60)
Glucose, Bld: 89 mg/dL (ref 65–99)
Potassium: 4.2 mmol/L (ref 3.5–5.3)
Sodium: 137 mmol/L (ref 135–146)

## 2016-02-20 LAB — IRON AND TIBC
%SAT: 35 % (ref 15–60)
Iron: 126 ug/dL (ref 50–180)
TIBC: 358 ug/dL (ref 250–425)
UIBC: 232 ug/dL (ref 125–400)

## 2016-02-20 LAB — LIPID PANEL
Cholesterol: 168 mg/dL (ref 125–200)
HDL: 49 mg/dL (ref >=40)
LDL Cholesterol: 98 mg/dL (ref <130)
Total CHOL/HDL Ratio: 3.4 Ratio (ref <=5.0)
Triglycerides: 104 mg/dL (ref <150)
VLDL: 21 mg/dL (ref <30)

## 2016-02-20 LAB — CBC WITH DIFFERENTIAL/PLATELET
Basophils Absolute: 93 cells/uL (ref 0–200)
Basophils Relative: 1 %
Eosinophils Absolute: 186 cells/uL (ref 15–500)
Eosinophils Relative: 2 %
HCT: 44 % (ref 38.5–50.0)
Hemoglobin: 15 g/dL (ref 13.2–17.1)
Lymphocytes Relative: 24 %
Lymphs Abs: 2232 cells/uL (ref 850–3900)
MCH: 30.9 pg (ref 27.0–33.0)
MCHC: 34.1 g/dL (ref 32.0–36.0)
MCV: 90.5 fL (ref 80.0–100.0)
MPV: 9.6 fL (ref 7.5–12.5)
Monocytes Absolute: 837 cells/uL (ref 200–950)
Monocytes Relative: 9 %
Neutro Abs: 5952 cells/uL (ref 1500–7800)
Neutrophils Relative %: 64 %
Platelets: 234 10*3/uL (ref 140–400)
RBC: 4.86 MIL/uL (ref 4.20–5.80)
RDW: 13.9 % (ref 11.0–15.0)
WBC: 9.3 10*3/uL (ref 3.8–10.8)

## 2016-02-20 LAB — VITAMIN B12: Vitamin B-12: 298 pg/mL (ref 200–1100)

## 2016-02-20 LAB — HEPATIC FUNCTION PANEL
ALT: 28 U/L (ref 9–46)
AST: 20 U/L (ref 10–40)
Albumin: 4.1 g/dL (ref 3.6–5.1)
Alkaline Phosphatase: 85 U/L (ref 40–115)
Bilirubin, Direct: 0.2 mg/dL (ref <=0.2)
Indirect Bilirubin: 0.6 mg/dL (ref 0.2–1.2)
Total Bilirubin: 0.8 mg/dL (ref 0.2–1.2)
Total Protein: 6.8 g/dL (ref 6.1–8.1)

## 2016-02-20 LAB — TSH: TSH: 1.81 mIU/L (ref 0.40–4.50)

## 2016-02-20 LAB — PSA: PSA: 0.2 ng/mL (ref <=4.0)

## 2016-02-20 LAB — MAGNESIUM: Magnesium: 2.2 mg/dL (ref 1.5–2.5)

## 2016-02-20 NOTE — Patient Instructions (Signed)
Please cut the ziac in half.  Please monitor your pulse and your blood pressure.  The goal is to have heart rate in the 50-60s.  The goal for blood pressure is 120-140/60-85.    Please call the office if your blood pressure is running high.

## 2016-02-20 NOTE — Progress Notes (Signed)
Complete Physical  Assessment and Plan:    1. Encounter for general adult medical examination with abnormal findings  - CBC with Differential/Platelet - BASIC METABOLIC PANEL WITH GFR - Hepatic function panel - Magnesium  2. Essential hypertension -cut ziac in half as slightly bradycardic and pressures are good -monitor at home -dash diet -exercise as tolerated -call if BP's 150/90 or greater  3. Mixed hyperlipidemia  - Lipid panel  4. Need for prophylactic vaccination and inoculation against influenza  - Flu Vaccine QUAD with presevative  5. Screening for diabetes mellitus  - Hemoglobin A1c - Insulin, random  6. Screening for deficiency anemia  - Iron and TIBC - Vitamin B12  7. Screening for hematuria or proteinuria  - Urinalysis, Routine w reflex microscopic (not at [facility]) - Microalbumin / creatinine urine ratio  8. Screening for cardiovascular condition - EKG 12-Lead  9. Vitamin D deficiency  - VITAMIN D 25 Hydroxy (Vit-D Deficiency, Fractures)  10. Screening for thyroid disorder  - TSH  11. Screening for prostate cancer -psa    Discussed med's effects and SE's. Screening labs and tests as requested with regular follow-up as recommended.  HPI Patient presents for a complete physical.   His blood pressure has been controlled at home, today their BP is BP: 120/82 He does not workout. He denies chest pain, shortness of breath, dizziness.   He is on cholesterol medication and denies myalgias. His cholesterol is not at goal. The cholesterol last visit was:   Lab Results  Component Value Date   CHOL 177 09/19/2015   HDL 35 (L) 09/19/2015   LDLCALC 87 09/19/2015   TRIG 274 (H) 09/19/2015   CHOLHDL 5.1 (H) 09/19/2015    He reports that he is struggling with healthy diet.  He reports that it is mostly time constraints.  Last A1C in the office was:  Lab Results  Component Value Date   HGBA1C 5.6 09/19/2015    Patient is on Vitamin D  supplement.   Lab Results  Component Value Date   VD25OH 28 (L) 06/06/2015      Last PSA was: No results found for: PSA.  Denies BPH symptoms daytime frequency, double voiding, dysuria, hematuria, hesitancy, incontinence, intermittency, nocturia, sensation of incomplete bladder emptying, suprapubic pain, urgency or weak urinary stream.  He has no complaints currently.  He has been doing well.  He would like to have a flu shot.    Current Medications:  Current Outpatient Prescriptions on File Prior to Visit  Medication Sig Dispense Refill  . aspirin EC 81 MG tablet Take 1 tablet (81 mg total) by mouth daily.    Marland Kitchen. atorvastatin (LIPITOR) 80 MG tablet Take 1/2 to 1 tablet daily or as directed for Cholesterol 90 tablet 1  . bisoprolol-hydrochlorothiazide (ZIAC) 5-6.25 MG tablet TAKE 1 TABLET BY MOUTH EVERY DAY FOR BLOOD PRESSURE 30 tablet 0  . cholecalciferol (VITAMIN D) 1000 units tablet Take 1 tablet (1,000 Units total) by mouth daily.    . sertraline (ZOLOFT) 100 MG tablet TAKE 1 TABLET BY MOUTH DAILY. 90 tablet 1   No current facility-administered medications on file prior to visit.     Health Maintenance:  Immunization History  Administered Date(s) Administered  . Influenza,inj,quad, With Preservative 02/20/2016    Tetanus: 02/20/16 Flu vaccine: 2017 Eye Exam:  1.5 years ago Dentist:  Twice yearly visits  Patient Care Team: Lucky CowboyWilliam McKeown, MD as PCP - General (Internal Medicine)  Allergies:  Allergies  Allergen Reactions  . Penicillins  Hives    Childhood allergy    Medical History: No past medical history on file.  Surgical History:  Past Surgical History:  Procedure Laterality Date  . KNEE SURGERY Right     Family History: No family history on file.  Social History:   Social History  Substance Use Topics  . Smoking status: Never Smoker  . Smokeless tobacco: Not on file  . Alcohol use 0.0 oz/week     Comment: occasional    Review of Systems:   ROS  Physical Exam: Estimated body mass index is 44.6 kg/m as calculated from the following:   Height as of this encounter: 5\' 9"  (1.753 m).   Weight as of this encounter: 302 lb (137 kg). BP 120/82   Pulse (!) 46   Temp 98.2 F (36.8 C) (Temporal)   Resp 18   Ht 5\' 9"  (1.753 m)   Wt (!) 302 lb (137 kg)   BMI 44.60 kg/m   General Appearance: Well nourished, in no apparent distress.  Eyes: PERRLA, EOMs, conjunctiva no swelling or erythema ENT/Mouth: Ear canals clear bilaterally with no erythema, swelling, discharge.  TMs normal bilaterally with no erythema, bulging, or retractions.  Oropharynx clear and moist with no exudate, swelling, or erythema.  Dentition normal.   Neck: Supple, thyroid normal. No bruits, JVD, cervical adenopathy Respiratory: Respiratory effort normal, BS equal bilaterally without rales, rhonchi, wheezing or stridor.  Cardio: RRR without murmurs, rubs or gallops. Brisk peripheral pulses without edema.  Chest: symmetric, with normal excursions Abdomen: Soft, nontender, no guarding, rebound, hernias, masses, or organomegaly. Genitourinary:  Musculoskeletal: Full ROM all peripheral extremities,5/5 strength, and normal gait.  Skin: Warm, dry without rashes, lesions, ecchymosis. Neuro: A&Ox3, Cranial nerves intact, reflexes equal bilaterally. Normal muscle tone, no cerebellar symptoms. Sensation intact.  Psych: Normal affect, Insight and Judgment appropriate.   EKG: WNL no changes.  AORTA SCAN: WNL  Over 40 minutes of exam, counseling, chart review and critical decision making was performed  Mike Kramer 10:12 AM [facility]

## 2016-02-21 LAB — URINALYSIS, ROUTINE W REFLEX MICROSCOPIC
Bilirubin Urine: NEGATIVE
Glucose, UA: NEGATIVE
Hgb urine dipstick: NEGATIVE
Ketones, ur: NEGATIVE
Leukocytes, UA: NEGATIVE
Nitrite: NEGATIVE
Protein, ur: NEGATIVE
Specific Gravity, Urine: 1.012 (ref 1.001–1.035)
pH: 6.5 (ref 5.0–8.0)

## 2016-02-21 LAB — VITAMIN D 25 HYDROXY (VIT D DEFICIENCY, FRACTURES): Vit D, 25-Hydroxy: 31 ng/mL (ref 30–100)

## 2016-02-21 LAB — MICROALBUMIN / CREATININE URINE RATIO
Creatinine, Urine: 69 mg/dL (ref 20–370)
Microalb Creat Ratio: 3 mcg/mg creat (ref <30)
Microalb, Ur: 0.2 mg/dL

## 2016-02-21 LAB — HEMOGLOBIN A1C
Hgb A1c MFr Bld: 5.5 % (ref <5.7)
Mean Plasma Glucose: 111 mg/dL

## 2016-02-21 LAB — INSULIN, RANDOM: Insulin: 9.1 u[IU]/mL (ref 2.0–19.6)

## 2016-02-23 ENCOUNTER — Other Ambulatory Visit: Payer: Self-pay | Admitting: Internal Medicine

## 2016-02-24 ENCOUNTER — Encounter: Payer: Self-pay | Admitting: Internal Medicine

## 2016-03-22 DIAGNOSIS — G4733 Obstructive sleep apnea (adult) (pediatric): Secondary | ICD-10-CM | POA: Diagnosis not present

## 2016-05-07 DIAGNOSIS — G4733 Obstructive sleep apnea (adult) (pediatric): Secondary | ICD-10-CM | POA: Diagnosis not present

## 2016-06-04 ENCOUNTER — Other Ambulatory Visit: Payer: Self-pay | Admitting: Physician Assistant

## 2016-06-04 DIAGNOSIS — F32A Depression, unspecified: Secondary | ICD-10-CM

## 2016-06-04 DIAGNOSIS — F329 Major depressive disorder, single episode, unspecified: Secondary | ICD-10-CM

## 2016-06-07 DIAGNOSIS — G4733 Obstructive sleep apnea (adult) (pediatric): Secondary | ICD-10-CM | POA: Diagnosis not present

## 2016-07-08 DIAGNOSIS — G4733 Obstructive sleep apnea (adult) (pediatric): Secondary | ICD-10-CM | POA: Diagnosis not present

## 2016-07-14 ENCOUNTER — Other Ambulatory Visit: Payer: Self-pay | Admitting: Internal Medicine

## 2016-07-14 DIAGNOSIS — E785 Hyperlipidemia, unspecified: Secondary | ICD-10-CM

## 2016-08-05 ENCOUNTER — Other Ambulatory Visit: Payer: Self-pay | Admitting: Internal Medicine

## 2016-08-25 ENCOUNTER — Ambulatory Visit: Payer: Self-pay | Admitting: Internal Medicine

## 2016-08-27 ENCOUNTER — Ambulatory Visit: Payer: Self-pay | Admitting: Internal Medicine

## 2016-08-27 NOTE — Progress Notes (Signed)
RESCHEDULED

## 2016-09-05 DIAGNOSIS — G4733 Obstructive sleep apnea (adult) (pediatric): Secondary | ICD-10-CM | POA: Diagnosis not present

## 2016-09-23 ENCOUNTER — Encounter: Payer: Self-pay | Admitting: *Deleted

## 2016-09-25 ENCOUNTER — Encounter: Payer: Self-pay | Admitting: Physician Assistant

## 2016-09-25 DIAGNOSIS — H9319 Tinnitus, unspecified ear: Secondary | ICD-10-CM

## 2016-10-05 DIAGNOSIS — G4733 Obstructive sleep apnea (adult) (pediatric): Secondary | ICD-10-CM | POA: Diagnosis not present

## 2016-11-05 DIAGNOSIS — G4733 Obstructive sleep apnea (adult) (pediatric): Secondary | ICD-10-CM | POA: Diagnosis not present

## 2016-11-11 ENCOUNTER — Other Ambulatory Visit: Payer: Self-pay | Admitting: Internal Medicine

## 2016-11-25 ENCOUNTER — Other Ambulatory Visit: Payer: Self-pay | Admitting: Internal Medicine

## 2016-11-25 DIAGNOSIS — F329 Major depressive disorder, single episode, unspecified: Secondary | ICD-10-CM

## 2016-11-25 DIAGNOSIS — F32A Depression, unspecified: Secondary | ICD-10-CM

## 2016-12-05 DIAGNOSIS — G4733 Obstructive sleep apnea (adult) (pediatric): Secondary | ICD-10-CM | POA: Diagnosis not present

## 2016-12-20 ENCOUNTER — Encounter: Payer: Self-pay | Admitting: Physician Assistant

## 2016-12-22 ENCOUNTER — Encounter: Payer: Self-pay | Admitting: Physician Assistant

## 2016-12-22 ENCOUNTER — Ambulatory Visit (INDEPENDENT_AMBULATORY_CARE_PROVIDER_SITE_OTHER): Payer: BLUE CROSS/BLUE SHIELD | Admitting: Physician Assistant

## 2016-12-22 VITALS — BP 122/90 | HR 68 | Temp 97.5°F | Resp 16 | Ht 69.0 in | Wt 304.2 lb

## 2016-12-22 DIAGNOSIS — R42 Dizziness and giddiness: Secondary | ICD-10-CM | POA: Diagnosis not present

## 2016-12-22 DIAGNOSIS — I10 Essential (primary) hypertension: Secondary | ICD-10-CM | POA: Diagnosis not present

## 2016-12-22 LAB — CBC WITH DIFFERENTIAL/PLATELET
Basophils Absolute: 88 cells/uL (ref 0–200)
Basophils Relative: 2 %
Eosinophils Absolute: 220 cells/uL (ref 15–500)
Eosinophils Relative: 5 %
HCT: 44.1 % (ref 38.5–50.0)
Hemoglobin: 14.8 g/dL (ref 13.2–17.1)
Lymphocytes Relative: 37 %
Lymphs Abs: 1628 cells/uL (ref 850–3900)
MCH: 30.5 pg (ref 27.0–33.0)
MCHC: 33.6 g/dL (ref 32.0–36.0)
MCV: 90.7 fL (ref 80.0–100.0)
MPV: 10.1 fL (ref 7.5–12.5)
Monocytes Absolute: 572 cells/uL (ref 200–950)
Monocytes Relative: 13 %
Neutro Abs: 1892 cells/uL (ref 1500–7800)
Neutrophils Relative %: 43 %
Platelets: 153 10*3/uL (ref 140–400)
RBC: 4.86 MIL/uL (ref 4.20–5.80)
RDW: 14.1 % (ref 11.0–15.0)
WBC: 4.4 10*3/uL (ref 3.8–10.8)

## 2016-12-22 LAB — TSH: TSH: 1.91 mIU/L (ref 0.40–4.50)

## 2016-12-22 NOTE — Patient Instructions (Signed)
Stop the ziac Keep an eye on your BP Call if above 140/80 consistently  Drink 80-100 oz a day of water, measure it out Eat 3 meals a day, have to do breakfast, eat protein- hard boiled eggs, protein bar like nature valley protein bar, greek yogurt like oikos triple zero, chobani 100, or light n fit greek   Your ears and sinuses are connected by the eustachian tube. When your sinuses are inflamed, this can close off the tube and cause fluid to collect in your middle ear. This can then cause dizziness, popping, clicking, ringing, and echoing in your ears. This is often NOT an infection and does NOT require antibiotics, it is caused by inflammation so the treatments help the inflammation. This can take a long time to get better so please be patient.  Here are things you can do to help with this: - Try the Flonase or Nasonex. Remember to spray each nostril twice towards the outer part of your eye.  Do not sniff but instead pinch your nose and tilt your head back to help the medicine get into your sinuses.  The best time to do this is at bedtime.Stop if you get blurred vision or nose bleeds.  -While drinking fluids, pinch and hold nose close and swallow, to help open eustachian tubes to drain fluid behind ear drums. -Please pick one of the over the counter allergy medications below and take it once daily for allergies.  It will also help with fluid behind ear drums. Claritin or loratadine cheapest but likely the weakest  Zyrtec or certizine at night because it can make you sleepy The strongest is allegra or fexafinadine  Cheapest at [employer], [employer], [employer] -can use decongestant over the counter, please do not use if you have high blood pressure or certain heart conditions.   if worsening HA, changes vision/speech, imbalance, weakness go to the ER   Monitor your blood pressure at home. Go to the ER if any CP, SOB, nausea, dizziness, severe HA, changes vision/speech  Goal BP:  For patients younger  than 60: Goal BP < 140/90. For patients 60 and older: Goal BP < 150/90. For patients with diabetes: Goal BP < 140/90. Your most recent BP: BP: 122/90 (off B/P meds for 2 days now)   Take your medications faithfully as instructed. Maintain a healthy weight. Get at least 150 minutes of aerobic exercise per week. Minimize salt intake. Minimize alcohol intake  DASH Eating Plan DASH stands for "Dietary Approaches to Stop Hypertension." The DASH eating plan is a healthy eating plan that has been shown to reduce high blood pressure (hypertension). Additional health benefits may include reducing the risk of type 2 diabetes mellitus, heart disease, and stroke. The DASH eating plan may also help with weight loss. WHAT DO I NEED TO KNOW ABOUT THE DASH EATING PLAN? For the DASH eating plan, you will follow these general guidelines:  Choose foods with a percent daily value for sodium of less than 5% (as listed on the food label).  Use salt-free seasonings or herbs instead of table salt or sea salt.  Check with your health care provider or pharmacist before using salt substitutes.  Eat lower-sodium products, often labeled as "lower sodium" or "no salt added."  Eat fresh foods.  Eat more vegetables, fruits, and low-fat dairy products.  Choose whole grains. Look for the word "whole" as the first word in the ingredient list.  Choose fish and skinless chicken or [place] more often than red meat.  Limit fish, poultry, and meat to 6 oz (170 g) each day.  Limit sweets, desserts, sugars, and sugary drinks.  Choose heart-healthy fats.  Limit cheese to 1 oz (28 g) per day.  Eat more home-cooked food and less restaurant, buffet, and fast food.  Limit fried foods.  Cook foods using methods other than frying.  Limit canned vegetables. If you do use them, rinse them well to decrease the sodium.  When eating at a restaurant, ask that your food be prepared with less salt, or no salt if possible. WHAT  FOODS CAN I EAT? Seek help from a dietitian for individual calorie needs. Grains Whole grain or whole wheat bread. Brown rice. Whole grain or whole wheat pasta. Quinoa, bulgur, and whole grain cereals. Low-sodium cereals. Corn or whole wheat flour tortillas. Whole grain cornbread. Whole grain crackers. Low-sodium crackers. Vegetables Fresh or frozen vegetables (raw, steamed, roasted, or grilled). Low-sodium or reduced-sodium tomato and vegetable juices. Low-sodium or reduced-sodium tomato sauce and paste. Low-sodium or reduced-sodium canned vegetables.  Fruits All fresh, canned (in natural juice), or frozen fruits. Meat and Other Protein Products Ground beef (85% or leaner), grass-fed beef, or beef trimmed of fat. Skinless chicken or [place]. Ground chicken or [place]. Pork trimmed of fat. All fish and seafood. Eggs. Dried beans, peas, or lentils. Unsalted nuts and seeds. Unsalted canned beans. Dairy Low-fat dairy products, such as skim or 1% milk, 2% or reduced-fat cheeses, low-fat ricotta or cottage cheese, or plain low-fat yogurt. Low-sodium or reduced-sodium cheeses. Fats and Oils Tub margarines without trans fats. Light or reduced-fat mayonnaise and salad dressings (reduced sodium). Avocado. Safflower, olive, or canola oils. Natural peanut or almond butter. Other Unsalted popcorn and pretzels. The items listed above may not be a complete list of recommended foods or beverages. Contact your dietitian for more options. WHAT FOODS ARE NOT RECOMMENDED? Grains White bread. White pasta. White rice. Refined cornbread. Bagels and croissants. Crackers that contain trans fat. Vegetables Creamed or fried vegetables. Vegetables in a cheese sauce. Regular canned vegetables. Regular canned tomato sauce and paste. Regular tomato and vegetable juices. Fruits Dried fruits. Canned fruit in light or heavy syrup. Fruit juice. Meat and Other Protein Products Fatty cuts of meat. Ribs, chicken wings, bacon,  sausage, bologna, salami, chitterlings, fatback, hot dogs, bratwurst, and packaged luncheon meats. Salted nuts and seeds. Canned beans with salt. Dairy Whole or 2% milk, cream, half-and-half, and cream cheese. Whole-fat or sweetened yogurt. Full-fat cheeses or blue cheese. Nondairy creamers and whipped toppings. Processed cheese, cheese spreads, or cheese curds. Condiments Onion and garlic salt, seasoned salt, table salt, and sea salt. Canned and packaged gravies. Worcestershire sauce. Tartar sauce. Barbecue sauce. Teriyaki sauce. Soy sauce, including reduced sodium. Steak sauce. Fish sauce. Oyster sauce. Cocktail sauce. Horseradish. Ketchup and mustard. Meat flavorings and tenderizers. Bouillon cubes. Hot sauce. Tabasco sauce. Marinades. Taco seasonings. Relishes. Fats and Oils Butter, stick margarine, lard, shortening, ghee, and bacon fat. Coconut, palm kernel, or palm oils. Regular salad dressings. Other Pickles and olives. Salted popcorn and pretzels. The items listed above may not be a complete list of foods and beverages to avoid. Contact your dietitian for more information. WHERE CAN I FIND MORE INFORMATION? National Heart, Lung, and Blood Institute: [employer] Document Released: 04/16/2011 Document Revised: 09/11/2013 Document Reviewed: 03/01/2013 [facility] Patient Information 2015 [place], [place]. This information is not intended to replace advice given to you by your health care provider. Make sure you discuss any questions you have with your health care provider.

## 2016-12-22 NOTE — Progress Notes (Signed)
Subjective:    Patient ID: Mike Kramer, male    DOB: 12/12/1970, 46 y.o.   MRN: [id number]  HPI 46 y.o. WM presents with dizziness and lightheaded, BP was 125/66 HR in 50-60s. He was on ziac 5 mg, suppose to cut in half last visit but had not. He stopped the ziac x Saturday and states he woke up this AM and felt better.  He has been drinking plenty of fluids, HA sat and Sunday, no changes in vision/speech, weakness, vomiting, diarrhea.   BMI is Body mass index is 44.92 kg/m., he is working on diet and exercise. Wt Readings from Last 3 Encounters:  12/22/16 (!) 304 lb 3.2 oz (138 kg)  02/20/16 (!) 302 lb (137 kg)  09/19/15 296 lb (134.3 kg)    Blood pressure 122/90, pulse 68, temperature (!) 97.5 F (36.4 C), resp. rate 16, height 5\' 9"  (1.753 m), weight (!) 304 lb 3.2 oz (138 kg), SpO2 98 %.  Medications Current Outpatient Prescriptions on File Prior to Visit  Medication Sig  . atorvastatin (LIPITOR) 80 MG tablet TAKE 1/2 TO 1 TABLET DAILY OR AS DIRECTED FOR CHOLESTEROL  . cholecalciferol (VITAMIN D) 1000 units tablet Take 1 tablet (1,000 Units total) by mouth daily.  . sertraline (ZOLOFT) 100 MG tablet TAKE 1 TABLET BY MOUTH DAILY.  . bisoprolol-hydrochlorothiazide (ZIAC) 5-6.25 MG tablet TAKE 1 TABLET BY MOUTH EVERY DAY FOR BLOOD PRESSURE (Patient not taking: Reported on 12/22/2016)   No current facility-administered medications on file prior to visit.     Problem list He has Labile HTN; Morbid obesity (BMI 41.38); BMI 41.38; Hyperlipidemia; Vitamin D deficiency; Screening for diabetes mellitus (DM); and OSA (obstructive sleep apnea) on his problem list.    Review of Systems  Constitutional: Negative.   HENT: Negative.   Respiratory: Negative.   Cardiovascular: Negative.   Gastrointestinal: Negative.   Genitourinary: Negative.   Musculoskeletal: Negative.   Skin: Negative.   Neurological: Negative.   Psychiatric/Behavioral: Negative.        Objective:   Physical  Exam  Constitutional: He is oriented to person, place, and time. He appears well-developed and well-nourished.  HENT:  Head: Normocephalic and atraumatic.  Right Ear: External ear normal. A middle ear effusion is present.  Left Ear: External ear normal. A middle ear effusion is present.  Mouth/Throat: Oropharynx is clear and moist.  Eyes: Pupils are equal, round, and reactive to light. Conjunctivae and EOM are normal.  Neck: Normal range of motion. Neck supple.  Cardiovascular: Normal rate, regular rhythm and normal heart sounds.   Pulmonary/Chest: Effort normal and breath sounds normal.  Abdominal: Soft. Bowel sounds are normal.  Musculoskeletal: Normal range of motion.  Neurological: He is alert and oriented to person, place, and time. No cranial nerve deficit.  Skin: Skin is warm and dry.  Psychiatric: He has a normal mood and affect. His behavior is normal.      Assessment & Plan:    Essential hypertension Stop ziac for now, monitor BP -     CBC with Differential/Platelet -     BASIC METABOLIC PANEL WITH GFR -     Hepatic function panel -     TSH  Dizziness Improved since off ziac Given information about effusion Normal neuro -     CBC with Differential/Platelet -     BASIC METABOLIC PANEL WITH GFR -     Hepatic function panel -     TSH     Future Appointments Date  Time Provider Department Center  01/20/2017 4:00 PM Lucky Cowboy, MD [facility] None  03/04/2017 4:15 PM Quentin Mulling, PA-C [facility] None

## 2016-12-23 LAB — HEPATIC FUNCTION PANEL
ALT: 41 U/L (ref 9–46)
AST: 35 U/L (ref 10–40)
Albumin: 4 g/dL (ref 3.6–5.1)
Alkaline Phosphatase: 91 U/L (ref 40–115)
Bilirubin, Direct: 0.1 mg/dL (ref <=0.2)
Indirect Bilirubin: 0.6 mg/dL (ref 0.2–1.2)
Total Bilirubin: 0.7 mg/dL (ref 0.2–1.2)
Total Protein: 6.3 g/dL (ref 6.1–8.1)

## 2016-12-23 LAB — BASIC METABOLIC PANEL WITH GFR
BUN: 13 mg/dL (ref 7–25)
CO2: 22 mmol/L (ref 20–32)
Calcium: 9 mg/dL (ref 8.6–10.3)
Chloride: 105 mmol/L (ref 98–110)
Creat: 0.99 mg/dL (ref 0.60–1.35)
GFR, Est African American: >89 mL/min (ref >=60)
GFR, Est Non African American: >89 mL/min (ref >=60)
Glucose, Bld: 82 mg/dL (ref 65–99)
Potassium: 4.3 mmol/L (ref 3.5–5.3)
Sodium: 139 mmol/L (ref 135–146)

## 2017-01-05 DIAGNOSIS — G4733 Obstructive sleep apnea (adult) (pediatric): Secondary | ICD-10-CM | POA: Diagnosis not present

## 2017-01-19 ENCOUNTER — Encounter: Payer: Self-pay | Admitting: Internal Medicine

## 2017-01-20 ENCOUNTER — Ambulatory Visit: Payer: Self-pay | Admitting: Internal Medicine

## 2017-02-03 ENCOUNTER — Encounter: Payer: Self-pay | Admitting: Internal Medicine

## 2017-02-05 DIAGNOSIS — G4733 Obstructive sleep apnea (adult) (pediatric): Secondary | ICD-10-CM | POA: Diagnosis not present

## 2017-02-09 ENCOUNTER — Encounter: Payer: Self-pay | Admitting: *Deleted

## 2017-02-10 ENCOUNTER — Encounter: Payer: Self-pay | Admitting: Internal Medicine

## 2017-02-14 ENCOUNTER — Other Ambulatory Visit: Payer: Self-pay | Admitting: Internal Medicine

## 2017-02-22 ENCOUNTER — Encounter: Payer: Self-pay | Admitting: Internal Medicine

## 2017-03-04 ENCOUNTER — Encounter: Payer: Self-pay | Admitting: Physician Assistant

## 2017-03-05 ENCOUNTER — Encounter: Payer: Self-pay | Admitting: Adult Health

## 2017-03-08 ENCOUNTER — Encounter: Payer: Self-pay | Admitting: Internal Medicine

## 2017-03-17 ENCOUNTER — Encounter: Payer: Self-pay | Admitting: Adult Health

## 2017-03-30 NOTE — Progress Notes (Deleted)
Complete Physical  Assessment and Plan:  Diagnoses and all orders for this visit:  Encounter for general adult medical examination with abnormal findings  Essential hypertension  OSA (obstructive sleep apnea)  Mixed hyperlipidemia  Vitamin D deficiency  Morbid obesity (HCC)  Screening for diabetes mellitus (DM)  Screening for hematuria or proteinuria  Screening for cardiovascular condition  Screening for thyroid disorder  Screening for prostate cancer  Medication management     Discussed med's effects and SE's. Screening labs and tests as requested with regular follow-up as recommended. Over 40 minutes of exam, counseling, chart review and critical decision making was performed  Future Appointments  Date Time Provider Department Center  03/31/2017 10:00 AM Judd Gaudierorbett, Thanos Cousineau, NP [facility] None  03/31/2018 10:00 AM Judd Gaudierorbett, Hina Gupta, NP [facility] None     HPI Patient presents for a complete physical. has Labile HTN; Morbid obesity (HCC); Hyperlipidemia; Vitamin D deficiency; and OSA (obstructive sleep apnea) on their problem list.  Sertraline 100 mg daily - no dx ?  OSA - on CPAP?  BMI is There is no height or weight on file to calculate BMI., he {HAS HAS WNU:27253}OT:18834} been working on diet and exercise. Wt Readings from Last 3 Encounters:  12/22/16 (!) 304 lb 3.2 oz (138 kg)  02/20/16 (!) 302 lb (137 kg)  09/19/15 296 lb (134.3 kg)   His blood pressure {HAS HAS NOT:18834} been controlled at home, today their BP is   He {DOES_DOES GUY:40347}OT:18564} workout. He denies chest pain, shortness of breath, dizziness.   He is on cholesterol medication and denies myalgias. His cholesterol is at goal. The cholesterol last visit was:   Lab Results  Component Value Date   CHOL 168 02/20/2016   HDL 49 02/20/2016   LDLCALC 98 02/20/2016   TRIG 104 02/20/2016   CHOLHDL 3.4 02/20/2016   . Last A1C in the office was:  Lab Results  Component Value Date   HGBA1C 5.5 02/20/2016    Last GFR: Lab Results  Component Value Date   GFRNONAA >89 12/22/2016    Patient is not on Vitamin D supplement and was low at last visit:    Lab Results  Component Value Date   VD25OH 31 02/20/2016     Last PSA was: Lab Results  Component Value Date   PSA 0.2 02/20/2016    Current Medications:  Current Outpatient Medications on File Prior to Visit  Medication Sig Dispense Refill  . atorvastatin (LIPITOR) 80 MG tablet TAKE 1/2 TO 1 TABLET DAILY OR AS DIRECTED FOR CHOLESTEROL 90 tablet 0  . bisoprolol-hydrochlorothiazide (ZIAC) 5-6.25 MG tablet Take 1 tablet daily for BP - Need Office Visit before further refills 30 tablet 0  . cholecalciferol (VITAMIN D) 1000 units tablet Take 1 tablet (1,000 Units total) by mouth daily.    . sertraline (ZOLOFT) 100 MG tablet TAKE 1 TABLET BY MOUTH DAILY. 90 tablet 1   No current facility-administered medications on file prior to visit.    Allergies:  Allergies  Allergen Reactions  . Penicillins Hives    Childhood allergy   Health Maintenance:  Immunization History  Administered Date(s) Administered  . Influenza,inj,quad, With Preservative 02/20/2016    Tetanus: 02/20/16 Flu vaccine: 2017  Colonoscopy: n/a  Abd CT: 02/2015 - unremarkable  Eye Exam: Dentist:  Patient Care Team: Lucky CowboyMcKeown, [name], MD as PCP - General (Internal Medicine)  Medical History:  has Labile HTN; Morbid obesity (HCC); Hyperlipidemia; Vitamin D deficiency; Screening for diabetes mellitus (DM); and OSA (obstructive sleep apnea)  on their problem list. Surgical History:  He  has a past surgical history that includes Knee surgery (Right). Family History:  His family history is not on file. Social History:   reports that  has never smoked. he has never used smokeless tobacco. He reports that he drinks alcohol. He reports that he does not use drugs. Review of Systems:  ROS  Physical Exam: Estimated body mass index is 44.92 kg/m as calculated from the  following:   Height as of 12/22/16: 5\' 9"  (1.753 m).   Weight as of 12/22/16: 304 lb 3.2 oz (138 kg). There were no vitals taken for this visit. General Appearance: Well nourished, in no apparent distress.  Eyes: PERRLA, EOMs, conjunctiva no swelling or erythema, normal fundi and vessels.  Sinuses: No Frontal/maxillary tenderness  ENT/Mouth: Ext aud canals clear, normal light reflex with TMs without erythema, bulging. Good dentition. No erythema, swelling, or exudate on post pharynx. Tonsils not swollen or erythematous. Hearing normal.  Neck: Supple, thyroid normal. No bruits  Respiratory: Respiratory effort normal, BS equal bilaterally without rales, rhonchi, wheezing or stridor.  Cardio: RRR without murmurs, rubs or gallops. Brisk peripheral pulses without edema.  Chest: symmetric, with normal excursions and percussion.  Abdomen: Soft, nontender, no guarding, rebound, hernias, masses, or organomegaly.  Lymphatics: Non tender without lymphadenopathy.  Genitourinary:  Musculoskeletal: Full ROM all peripheral extremities,5/5 strength, and normal gait.  Skin: Warm, dry without rashes, lesions, ecchymosis. Neuro: Cranial nerves intact, reflexes equal bilaterally. Normal muscle tone, no cerebellar symptoms. Sensation intact.  Psych: Awake and oriented X 3, normal affect, Insight and Judgment appropriate.   EKG: WNL no changes.  Carlyon ShadowAshley C Alicya Bena 2:08 PM [facility]

## 2017-03-31 ENCOUNTER — Encounter: Payer: Self-pay | Admitting: Adult Health

## 2017-05-25 ENCOUNTER — Other Ambulatory Visit: Payer: Self-pay | Admitting: Internal Medicine

## 2017-05-25 DIAGNOSIS — F32A Depression, unspecified: Secondary | ICD-10-CM

## 2017-05-25 DIAGNOSIS — F329 Major depressive disorder, single episode, unspecified: Secondary | ICD-10-CM

## 2017-06-01 NOTE — Progress Notes (Deleted)
FOLLOW UP  Assessment and Plan:   Hypertension -Continue medication, monitor blood pressure at home. Continue DASH diet.  Reminder to go to the ER if any CP, SOB, nausea, dizziness, severe HA, changes vision/speech, left arm numbness and tingling and jaw pain.  Cholesterol -Continue diet and exercise. Check cholesterol.    Prediabetes  -Continue diet and exercise. Check A1C  Vitamin D Def - check level and continue medications.   Continue diet and meds as discussed. Further disposition pending results of labs. Over 30 minutes of exam, counseling, chart review, and critical decision making was performed  Future Appointments  Date Time Provider Department Center  06/02/2017 10:30 AM Quentin Mullingollier, Shiree Altemus, PA-C [facility] None     HPI 47 y.o. male  presents for 3 month follow up on hypertension, cholesterol, prediabetes, and vitamin D deficiency.   His blood pressure {HAS HAS NOT:18834} been controlled at home, today their BP is     He {DOES_DOES ZOX:09604}OT:18564} workout. He denies chest pain, shortness of breath, dizziness.   He  {ACTION; IS/IS VWU:98119147}OT:21021397}  on cholesterol medication and denies myalgias. His cholesterol {ACTION; IS/IS NOT:21021397} at goal. The cholesterol last visit was:     He {Has/has not:18111} been working on diet and exercise for prediabetes, and denies {Symptoms; diabetes w/o none:19199}. Last A1C in the office was:  Lab Results  Component Value Date   HGBA1C 5.5 02/20/2016    Patient is on Vitamin D supplement.   Lab Results  Component Value Date   VD25OH 31 02/20/2016     BMI is There is no height or weight on file to calculate BMI., he is working on diet and exercise. Wt Readings from Last 3 Encounters:  12/22/16 (!) 304 lb 3.2 oz (138 kg)  02/20/16 (!) 302 lb (137 kg)  09/19/15 296 lb (134.3 kg)     Current Medications:  Current Outpatient Medications on File Prior to Visit  Medication Sig  . atorvastatin (LIPITOR) 80 MG tablet TAKE 1/2 TO 1 TABLET  DAILY OR AS DIRECTED FOR CHOLESTEROL  . bisoprolol-hydrochlorothiazide (ZIAC) 5-6.25 MG tablet Take 1 tablet daily for BP - Need Office Visit before further refills  . cholecalciferol (VITAMIN D) 1000 units tablet Take 1 tablet (1,000 Units total) by mouth daily.  . sertraline (ZOLOFT) 100 MG tablet 1 tablet daily - overdue office visit   No current facility-administered medications on file prior to visit.     Medical History: No past medical history on file. Allergies:  Allergies  Allergen Reactions  . Penicillins Hives    Childhood allergy     Review of Systems:  ROS  Family history- Review and unchanged Social history- Review and unchanged Physical Exam: There were no vitals taken for this visit. Wt Readings from Last 3 Encounters:  12/22/16 (!) 304 lb 3.2 oz (138 kg)  02/20/16 (!) 302 lb (137 kg)  09/19/15 296 lb (134.3 kg)   General Appearance: Well nourished, in no apparent distress. Eyes: PERRLA, EOMs, conjunctiva no swelling or erythema Sinuses: No Frontal/maxillary tenderness ENT/Mouth: Ext aud canals clear, TMs without erythema, bulging. No erythema, swelling, or exudate on post pharynx.  Tonsils not swollen or erythematous. Hearing normal.  Neck: Supple, thyroid normal.  Respiratory: Respiratory effort normal, BS equal bilaterally without rales, rhonchi, wheezing or stridor.  Cardio: RRR with no MRGs. Brisk peripheral pulses without edema.  Abdomen: Soft, + BS,  Non tender, no guarding, rebound, hernias, masses. Lymphatics: Non tender without lymphadenopathy.  Musculoskeletal: Full ROM, 5/5 strength, {PSY -  GAIT AND STATION:22860} gait Skin: Warm, dry without rashes, lesions, ecchymosis.  Neuro: Cranial nerves intact. Normal muscle tone, no cerebellar symptoms. Psych: Awake and oriented X 3, normal affect, Insight and Judgment appropriate.    Quentin Mulling, PA-C 1:58 PM [facility]

## 2017-06-02 ENCOUNTER — Other Ambulatory Visit: Payer: Self-pay | Admitting: Physician Assistant

## 2017-06-02 ENCOUNTER — Ambulatory Visit: Payer: Self-pay | Admitting: Physician Assistant

## 2017-06-02 DIAGNOSIS — F32A Depression, unspecified: Secondary | ICD-10-CM

## 2017-06-02 DIAGNOSIS — F329 Major depressive disorder, single episode, unspecified: Secondary | ICD-10-CM

## 2017-06-02 MED ORDER — SERTRALINE HCL 100 MG PO TABS: ORAL_TABLET | ORAL | 0 refills | Status: DC

## 2017-06-07 NOTE — Progress Notes (Deleted)
FOLLOW UP  Assessment and Plan:   Hypertension -Continue medication, monitor blood pressure at home. Continue DASH diet.  Reminder to go to the ER if any CP, SOB, nausea, dizziness, severe HA, changes vision/speech, left arm numbness and tingling and jaw pain.  Cholesterol -Continue diet and exercise. Check cholesterol.    Prediabetes  -Continue diet and exercise. Check A1C  Vitamin D Def - check level and continue medications.   Continue diet and meds as discussed. Further disposition pending results of labs. Over 30 minutes of exam, counseling, chart review, and critical decision making was performed  Future Appointments  Date Time Provider Department Center  06/09/2017  4:15 PM Mike Mulling, PA-C [facility] None     HPI 47 y.o. male  presents for 3 month follow up on hypertension, cholesterol, prediabetes, and vitamin D deficiency.   His blood pressure {HAS HAS NOT:18834} been controlled at home, today their BP is     He {DOES_DOES ZOX:09604} workout. He denies chest pain, shortness of breath, dizziness.   He  {ACTION; IS/IS VWU:98119147}  on cholesterol medication and denies myalgias. His cholesterol {ACTION; IS/IS NOT:21021397} at goal. The cholesterol last visit was:     He {Has/has not:18111} been working on diet and exercise for prediabetes, and denies {Symptoms; diabetes w/o none:19199}. Last A1C in the office was:  Lab Results  Component Value Date   HGBA1C 5.5 02/20/2016    Patient is on Vitamin D supplement.   Lab Results  Component Value Date   VD25OH 31 02/20/2016     BMI is There is no height or weight on file to calculate BMI., he is working on diet and exercise. Wt Readings from Last 3 Encounters:  12/22/16 (!) 304 lb 3.2 oz (138 kg)  02/20/16 (!) 302 lb (137 kg)  09/19/15 296 lb (134.3 kg)     Current Medications:  Current Outpatient Medications on File Prior to Visit  Medication Sig  . atorvastatin (LIPITOR) 80 MG tablet TAKE 1/2 TO 1 TABLET  DAILY OR AS DIRECTED FOR CHOLESTEROL  . bisoprolol-hydrochlorothiazide (ZIAC) 5-6.25 MG tablet Take 1 tablet daily for BP - Need Office Visit before further refills  . cholecalciferol (VITAMIN D) 1000 units tablet Take 1 tablet (1,000 Units total) by mouth daily.  . sertraline (ZOLOFT) 100 MG tablet 1 tablet daily - overdue office visit   No current facility-administered medications on file prior to visit.     Medical History: No past medical history on file. Allergies:  Allergies  Allergen Reactions  . Penicillins Hives    Childhood allergy     Review of Systems:  ROS  Family history- Review and unchanged Social history- Review and unchanged Physical Exam: There were no vitals taken for this visit. Wt Readings from Last 3 Encounters:  12/22/16 (!) 304 lb 3.2 oz (138 kg)  02/20/16 (!) 302 lb (137 kg)  09/19/15 296 lb (134.3 kg)   General Appearance: Well nourished, in no apparent distress. Eyes: PERRLA, EOMs, conjunctiva no swelling or erythema Sinuses: No Frontal/maxillary tenderness ENT/Mouth: Ext aud canals clear, TMs without erythema, bulging. No erythema, swelling, or exudate on post pharynx.  Tonsils not swollen or erythematous. Hearing normal.  Neck: Supple, thyroid normal.  Respiratory: Respiratory effort normal, BS equal bilaterally without rales, rhonchi, wheezing or stridor.  Cardio: RRR with no MRGs. Brisk peripheral pulses without edema.  Abdomen: Soft, + BS,  Non tender, no guarding, rebound, hernias, masses. Lymphatics: Non tender without lymphadenopathy.  Musculoskeletal: Full ROM, 5/5 strength, {PSY -  GAIT AND STATION:22860} gait Skin: Warm, dry without rashes, lesions, ecchymosis.  Neuro: Cranial nerves intact. Normal muscle tone, no cerebellar symptoms. Psych: Awake and oriented X 3, normal affect, Insight and Judgment appropriate.    Mike MullingAmanda Evalin Shawhan, PA-C 9:02 AM [facility]

## 2017-06-09 ENCOUNTER — Ambulatory Visit: Payer: Self-pay | Admitting: Physician Assistant

## 2017-06-20 NOTE — Progress Notes (Deleted)
FOLLOW UP  Assessment and Plan:   Hypertension -Continue medication, monitor blood pressure at home. Continue DASH diet.  Reminder to go to the ER if any CP, SOB, nausea, dizziness, severe HA, changes vision/speech, left arm numbness and tingling and jaw pain.  Cholesterol -Continue diet and exercise. Check cholesterol.    Prediabetes  -Continue diet and exercise. Check A1C  Vitamin D Def - check level and continue medications.   Continue diet and meds as discussed. Further disposition pending results of labs. Over 30 minutes of exam, counseling, chart review, and critical decision making was performed  Future Appointments  Date Time Provider Department Center  06/22/2017  3:30 PM Mike Mulling, PA-C [facility] None     HPI 47 y.o. male  presents for 3 month follow up on hypertension, cholesterol, prediabetes, and vitamin D deficiency.   His blood pressure {HAS HAS NOT:18834} been controlled at home, today their BP is     He {DOES_DOES ZOX:09604} workout. He denies chest pain, shortness of breath, dizziness.   He  {ACTION; IS/IS VWU:[id number]}  on cholesterol medication and denies myalgias. His cholesterol {ACTION; IS/IS NOT:21021397} at goal. The cholesterol last visit was:     He {Has/has not:18111} been working on diet and exercise for prediabetes, and denies {Symptoms; diabetes w/o none:19199}. Last A1C in the office was:  Lab Results  Component Value Date   HGBA1C 5.5 02/20/2016    Patient is on Vitamin D supplement.   Lab Results  Component Value Date   VD25OH 31 02/20/2016     BMI is There is no height or weight on file to calculate BMI., he is working on diet and exercise. Wt Readings from Last 3 Encounters:  12/22/16 (!) 304 lb 3.2 oz (138 kg)  02/20/16 (!) 302 lb (137 kg)  09/19/15 296 lb (134.3 kg)     Current Medications:  Current Outpatient Medications on File Prior to Visit  Medication Sig  . atorvastatin (LIPITOR) 80 MG tablet TAKE 1/2 TO 1 TABLET  DAILY OR AS DIRECTED FOR CHOLESTEROL  . bisoprolol-hydrochlorothiazide (ZIAC) 5-6.25 MG tablet Take 1 tablet daily for BP - Need Office Visit before further refills  . cholecalciferol (VITAMIN D) 1000 units tablet Take 1 tablet (1,000 Units total) by mouth daily.  . sertraline (ZOLOFT) 100 MG tablet 1 tablet daily - overdue office visit   No current facility-administered medications on file prior to visit.     Medical History: No past medical history on file. Allergies:  Allergies  Allergen Reactions  . Penicillins Hives    Childhood allergy     Review of Systems:  ROS  Family history- Review and unchanged Social history- Review and unchanged Physical Exam: There were no vitals taken for this visit. Wt Readings from Last 3 Encounters:  12/22/16 (!) 304 lb 3.2 oz (138 kg)  02/20/16 (!) 302 lb (137 kg)  09/19/15 296 lb (134.3 kg)   General Appearance: Well nourished, in no apparent distress. Eyes: PERRLA, EOMs, conjunctiva no swelling or erythema Sinuses: No Frontal/maxillary tenderness ENT/Mouth: Ext aud canals clear, TMs without erythema, bulging. No erythema, swelling, or exudate on post pharynx.  Tonsils not swollen or erythematous. Hearing normal.  Neck: Supple, thyroid normal.  Respiratory: Respiratory effort normal, BS equal bilaterally without rales, rhonchi, wheezing or stridor.  Cardio: RRR with no MRGs. Brisk peripheral pulses without edema.  Abdomen: Soft, + BS,  Non tender, no guarding, rebound, hernias, masses. Lymphatics: Non tender without lymphadenopathy.  Musculoskeletal: Full ROM, 5/5 strength, {PSY -  GAIT AND STATION:22860} gait Skin: Warm, dry without rashes, lesions, ecchymosis.  Neuro: Cranial nerves intact. Normal muscle tone, no cerebellar symptoms. Psych: Awake and oriented X 3, normal affect, Insight and Judgment appropriate.    Mike MullingAmanda Mekesha Solomon, PA-C 6:14 PM [facility]

## 2017-06-21 DIAGNOSIS — R7309 Other abnormal glucose: Secondary | ICD-10-CM | POA: Insufficient documentation

## 2017-06-21 NOTE — Progress Notes (Deleted)
FOLLOW UP  Assessment and Plan:   Hypertension Well controlled with current medications  Monitor blood pressure at home; patient to call if consistently greater than 130/80 Continue DASH diet.   Reminder to go to the ER if any CP, SOB, nausea, dizziness, severe HA, changes vision/speech, left arm numbness and tingling and jaw pain.  Cholesterol Currently at goal; continue statin at current dose  Continue low cholesterol diet and exercise.  Check lipid panel.    Other abnormal glucose Recent A1C at goal Continue diet and exercise for weight loss Perform daily foot/skin check, notify office of any concerning changes.  Check A1C  Morbid Obesity with co morbidities Long discussion about weight loss, diet, and exercise Recommended diet heavy in fruits and veggies and low in animal meats, cheeses, and dairy products, appropriate calorie intake Discussed ideal weight for height (below ***) and initial weight goal (***) Patient will work on *** Will follow up in 3 months  Vitamin D Def At goal at last visit; continue supplementation to maintain goal of 70-100 Defer Vit D level  Continue diet and meds as discussed. Further disposition pending results of labs. Discussed med's effects and SE's.   Over 30 minutes of exam, counseling, chart review, and critical decision making was performed.   Future Appointments  Date Time Provider Department Center  06/22/2017  3:30 PM Judd Gaudierorbett, Fannye Myer, NP [facility] None   ----------------------------------------------------------------------------------------------------------------------  HPI 47 y.o. male  presents for 3 month follow up on hypertension, cholesterol, glucose management, morbid obesity and vitamin D deficiency.      he has a diagnosis of *** and is currently on zoloft 100 mg daily, reports symptoms are*** well controlled on current regimen.  BMI is There is no height or weight on file to calculate BMI., he {HAS HAS BJY:78295}OT:18834}  been working on diet and exercise. Wt Readings from Last 3 Encounters:  12/22/16 (!) 304 lb 3.2 oz (138 kg)  02/20/16 (!) 302 lb (137 kg)  09/19/15 296 lb (134.3 kg)    His blood pressure {HAS HAS NOT:18834} been controlled at home, today their BP is    He {DOES_DOES AOZ:30865}OT:18564} workout. He denies chest pain, shortness of breath, dizziness.   He {ACTION; IS/IS HQI:[id number]}OT:21021397} on cholesterol medication and denies myalgias. His cholesterol {ACTION; IS/IS NOT:21021397} at goal. The cholesterol last visit was:   Lab Results  Component Value Date   CHOL 168 02/20/2016   HDL 49 02/20/2016   LDLCALC 98 02/20/2016   TRIG 104 02/20/2016   CHOLHDL 3.4 02/20/2016    He {Has/has not:18111} been working on diet and exercise for prediabetes, and denies {Symptoms; diabetes w/o none:19199}. Last A1C in the office was:  Lab Results  Component Value Date   HGBA1C 5.5 02/20/2016   Patient is on Vitamin D supplement.   Lab Results  Component Value Date   VD25OH 31 02/20/2016        Current Medications:  Current Outpatient Medications on File Prior to Visit  Medication Sig  . atorvastatin (LIPITOR) 80 MG tablet TAKE 1/2 TO 1 TABLET DAILY OR AS DIRECTED FOR CHOLESTEROL  . bisoprolol-hydrochlorothiazide (ZIAC) 5-6.25 MG tablet Take 1 tablet daily for BP - Need Office Visit before further refills  . cholecalciferol (VITAMIN D) 1000 units tablet Take 1 tablet (1,000 Units total) by mouth daily.  . sertraline (ZOLOFT) 100 MG tablet 1 tablet daily - overdue office visit   No current facility-administered medications on file prior to visit.      Allergies:  Allergies  Allergen Reactions  . Penicillins Hives    Childhood allergy     Medical History: No past medical history on file. Family history- Reviewed and unchanged Social history- Reviewed and unchanged   Review of Systems:  Review of Systems  Constitutional: Negative for malaise/fatigue and weight loss.  HENT: Negative for hearing  loss and tinnitus.   Eyes: Negative for blurred vision and double vision.  Respiratory: Negative for cough, shortness of breath and wheezing.   Cardiovascular: Negative for chest pain, palpitations, orthopnea, claudication and leg swelling.  Gastrointestinal: Negative for abdominal pain, blood in stool, constipation, diarrhea, heartburn, melena, nausea and vomiting.  Genitourinary: Negative.   Musculoskeletal: Negative for joint pain and myalgias.  Skin: Negative for rash.  Neurological: Negative for dizziness, tingling, sensory change, weakness and headaches.  Endo/Heme/Allergies: Negative for polydipsia.  Psychiatric/Behavioral: Negative.   All other systems reviewed and are negative.     Physical Exam: There were no vitals taken for this visit. Wt Readings from Last 3 Encounters:  12/22/16 (!) 304 lb 3.2 oz (138 kg)  02/20/16 (!) 302 lb (137 kg)  09/19/15 296 lb (134.3 kg)   General Appearance: Well nourished, in no apparent distress. Eyes: PERRLA, EOMs, conjunctiva no swelling or erythema Sinuses: No Frontal/maxillary tenderness ENT/Mouth: Ext aud canals clear, TMs without erythema, bulging. No erythema, swelling, or exudate on post pharynx.  Tonsils not swollen or erythematous. Hearing normal.  Neck: Supple, thyroid normal.  Respiratory: Respiratory effort normal, BS equal bilaterally without rales, rhonchi, wheezing or stridor.  Cardio: RRR with no MRGs. Brisk peripheral pulses without edema.  Abdomen: Soft, + BS.  Non tender, no guarding, rebound, hernias, masses. Lymphatics: Non tender without lymphadenopathy.  Musculoskeletal: Full ROM, 5/5 strength, {PSY - GAIT AND STATION:22860} gait Skin: Warm, dry without rashes, lesions, ecchymosis.  Neuro: Cranial nerves intact. No cerebellar symptoms.  Psych: Awake and oriented X 3, normal affect, Insight and Judgment appropriate.    Dan Maker, NP 1:59 PM [facility]

## 2017-06-22 ENCOUNTER — Other Ambulatory Visit: Payer: Self-pay | Admitting: Internal Medicine

## 2017-06-22 ENCOUNTER — Ambulatory Visit: Payer: Self-pay | Admitting: Physician Assistant

## 2017-06-22 ENCOUNTER — Encounter: Payer: Self-pay | Admitting: Physician Assistant

## 2017-06-22 ENCOUNTER — Ambulatory Visit: Payer: Self-pay | Admitting: Adult Health

## 2017-07-01 ENCOUNTER — Other Ambulatory Visit: Payer: Self-pay | Admitting: Physician Assistant

## 2017-07-01 DIAGNOSIS — F329 Major depressive disorder, single episode, unspecified: Secondary | ICD-10-CM

## 2017-07-01 DIAGNOSIS — F32A Depression, unspecified: Secondary | ICD-10-CM

## 2017-07-04 NOTE — Progress Notes (Signed)
FOLLOW UP  Assessment and Plan:   Hypertension Fair control off of medications; will check at home, call if diastolic consistently elevated - will initiate HCTZ only Monitor blood pressure at home; patient to call if consistently greater than 130/80 Continue DASH diet.   Reminder to go to the ER if any CP, SOB, nausea, dizziness, severe HA, changes vision/speech, left arm numbness and tingling and jaw pain.  Cholesterol Currently at goal; continue statin at current dose  Continue low cholesterol diet and exercise.  Check lipid panel.    Other abnormal glucose Recent A1C at goal Continue diet and exercise for weight loss Perform daily foot/skin check, notify office of any concerning changes.  Check A1C  Morbid Obesity with co morbidities Long discussion about weight loss, diet, and exercise Recommended diet heavy in fruits and veggies and low in animal meats, cheeses, and dairy products, appropriate calorie intake Discussed ideal weight for height  Patient will work on making healthy choices when eating out (skip the fries, get the side salad), fill up on veggies ,etc Will follow up in 3 months  Vitamin D Def Below goal at last check; continue to recommend supplementation to maintain goal of 70-100 Check Vit D level  Continue diet and meds as discussed. Further disposition pending results of labs. Discussed med's effects and SE's.   Over 30 minutes of exam, counseling, chart review, and critical decision making was performed.   No future appointments. ----------------------------------------------------------------------------------------------------------------------  HPI 47 y.o. male  presents for 3 month follow up on hypertension, cholesterol, glucose management, morbid obesity and vitamin D deficiency.   he has a diagnosis of anxiety and is currently on zoloft 100 mg daily, reports symptoms are well controlled on current regimen.  BMI is Body mass index is 45.63  kg/m., he has been working on diet and exercise. Wt Readings from Last 3 Encounters:  07/05/17 (!) 309 lb (140.2 kg)  12/22/16 (!) 304 lb 3.2 oz (138 kg)  02/20/16 (!) 302 lb (137 kg)   His blood pressure has been controlled at home, today their BP is BP: 128/90  He does not workout - walks all day at work.  He denies chest pain, shortness of breath, dizziness.   He is on cholesterol medication (atorvastatin 40 mg every other day) and denies myalgias. His cholesterol is at goal. The cholesterol last visit was:   Lab Results  Component Value Date   CHOL 168 02/20/2016   HDL 49 02/20/2016   LDLCALC 98 02/20/2016   TRIG 104 02/20/2016   CHOLHDL 3.4 02/20/2016    He has been working on diet and exercise for prediabetes, and denies increased appetite, nausea, paresthesia of the feet, polydipsia, polyuria, visual disturbances, vomiting and weight loss. Last A1C in the office was:  Lab Results  Component Value Date   HGBA1C 5.5 02/20/2016   Patient is on Vitamin D supplement.   Lab Results  Component Value Date   VD25OH 31 02/20/2016        Current Medications:  Current Outpatient Medications on File Prior to Visit  Medication Sig  . atorvastatin (LIPITOR) 80 MG tablet TAKE 1/2 TO 1 TABLET DAILY OR AS DIRECTED FOR CHOLESTEROL (Patient taking differently: Take 1/2 tablet by mouth every other day)  . cholecalciferol (VITAMIN D) 1000 units tablet Take 1 tablet (1,000 Units total) by mouth daily.  . sertraline (ZOLOFT) 100 MG tablet TAKE 1 TABLET BY MOUTH DAILY - PT OVERDUE TO OFFICE VISIT  . bisoprolol-hydrochlorothiazide (ZIAC) 5-6.25 MG  tablet Take 1 tablet daily for BP - Need Office Visit before further refills (Patient not taking: Reported on 07/05/2017)   No current facility-administered medications on file prior to visit.      Allergies:  Allergies  Allergen Reactions  . Penicillins Hives    Childhood allergy     Medical History: No past medical history on file. Family  history- Reviewed and unchanged Social history- Reviewed and unchanged   Review of Systems:  Review of Systems  Constitutional: Negative for malaise/fatigue and weight loss.  HENT: Negative for hearing loss and tinnitus.   Eyes: Negative for blurred vision and double vision.  Respiratory: Negative for cough, shortness of breath and wheezing.   Cardiovascular: Negative for chest pain, palpitations, orthopnea, claudication and leg swelling.  Gastrointestinal: Negative for abdominal pain, blood in stool, constipation, diarrhea, heartburn, melena, nausea and vomiting.  Genitourinary: Negative.   Musculoskeletal: Negative for joint pain and myalgias.  Skin: Negative for rash.  Neurological: Negative for dizziness, tingling, sensory change, weakness and headaches.  Endo/Heme/Allergies: Negative for polydipsia.  Psychiatric/Behavioral: Negative.   All other systems reviewed and are negative.     Physical Exam: BP 128/90   Pulse 63   Temp 97.7 F (36.5 C)   Ht 5\' 9"  (1.753 m)   Wt (!) 309 lb (140.2 kg)   SpO2 97%   BMI 45.63 kg/m  Wt Readings from Last 3 Encounters:  07/05/17 (!) 309 lb (140.2 kg)  12/22/16 (!) 304 lb 3.2 oz (138 kg)  02/20/16 (!) 302 lb (137 kg)   General Appearance: Well nourished, in no apparent distress. Eyes: PERRLA, EOMs, conjunctiva no swelling or erythema Sinuses: No Frontal/maxillary tenderness ENT/Mouth: Ext aud canals clear, TMs without erythema, bulging. No erythema, swelling, or exudate on post pharynx.  Tonsils not swollen or erythematous. Hearing normal.  Neck: Supple, thyroid normal.  Respiratory: Respiratory effort normal, BS equal bilaterally without rales, rhonchi, wheezing or stridor.  Cardio: RRR with no MRGs. Brisk peripheral pulses without edema.  Abdomen: Soft, + BS.  Non tender, no guarding, rebound, hernias, masses. Lymphatics: Non tender without lymphadenopathy.  Musculoskeletal: Full ROM, 5/5 strength, Normal gait Skin: Warm, dry  without rashes, lesions, ecchymosis.  Neuro: Cranial nerves intact. No cerebellar symptoms.  Psych: Awake and oriented X 3, normal affect, Insight and Judgment appropriate.    Dan Maker, NP 4:32 PM [facility]

## 2017-07-05 ENCOUNTER — Encounter: Payer: Self-pay | Admitting: Adult Health

## 2017-07-05 ENCOUNTER — Ambulatory Visit: Payer: BLUE CROSS/BLUE SHIELD | Admitting: Adult Health

## 2017-07-05 VITALS — BP 128/86 | HR 63 | Temp 97.7°F | Ht 69.0 in | Wt 309.0 lb

## 2017-07-05 DIAGNOSIS — E559 Vitamin D deficiency, unspecified: Secondary | ICD-10-CM | POA: Diagnosis not present

## 2017-07-05 DIAGNOSIS — F329 Major depressive disorder, single episode, unspecified: Secondary | ICD-10-CM

## 2017-07-05 DIAGNOSIS — Z79899 Other long term (current) drug therapy: Secondary | ICD-10-CM | POA: Diagnosis not present

## 2017-07-05 DIAGNOSIS — F419 Anxiety disorder, unspecified: Secondary | ICD-10-CM

## 2017-07-05 DIAGNOSIS — F32A Depression, unspecified: Secondary | ICD-10-CM

## 2017-07-05 DIAGNOSIS — E782 Mixed hyperlipidemia: Secondary | ICD-10-CM

## 2017-07-05 DIAGNOSIS — R7309 Other abnormal glucose: Secondary | ICD-10-CM

## 2017-07-05 DIAGNOSIS — I10 Essential (primary) hypertension: Secondary | ICD-10-CM | POA: Diagnosis not present

## 2017-07-05 MED ORDER — SERTRALINE HCL 100 MG PO TABS: ORAL_TABLET | ORAL | 1 refills | Status: DC

## 2017-07-05 NOTE — Patient Instructions (Signed)
  Aim for 7+ servings of fruits and vegetables daily  80+ fluid ounces of water or unsweet tea for healthy kidneys - avoid liquid calories  Limit alcohol to 1 drink per day - none is best  Limit animal fats in diet for cholesterol and heart health - choose grass fed whenever available  Aim for low stress - take time to unwind and care for your mental health  Aim for 150 min of moderate intensity exercise weekly for heart health, and weights twice weekly for bone health  Aim for 7-9 hours of sleep daily          When it comes to diets, agreement about the perfect plan isn't easy to find, even among the experts. Experts at the [employer] developed an idea known as the Healthy Eating Plate. Just imagine a plate divided into logical, healthy portions.  The emphasis is on diet quality:  Load up on vegetables and fruits - one-half of your plate: Aim for color and variety, and remember that potatoes don't count.  Go for whole grains - one-quarter of your plate: Whole wheat, barley, wheat berries, quinoa, oats, brown rice, and foods made with them. If you want pasta, go with whole wheat pasta.  Protein power - one-quarter of your plate: Fish, chicken, beans, and nuts are all healthy, versatile protein sources. Limit red meat.  The diet, however, does go beyond the plate, offering a few other suggestions.  Use healthy plant oils, such as olive, canola, soy, corn, sunflower and peanut. Check the labels, and avoid partially hydrogenated oil, which have unhealthy trans fats.  If you're thirsty, drink water. Coffee and tea are good in moderation, but skip sugary drinks and limit milk and dairy products to one or two daily servings.  The type of carbohydrate in the diet is more important than the amount. Some sources of carbohydrates, such as vegetables, fruits, whole grains, and beans-are healthier than others.  Finally, stay active.

## 2017-07-06 LAB — CBC WITH DIFFERENTIAL/PLATELET
Basophils Absolute: 59 cells/uL (ref 0–200)
Basophils Relative: 0.7 %
Eosinophils Absolute: 311 cells/uL (ref 15–500)
Eosinophils Relative: 3.7 %
HCT: 41.7 % (ref 38.5–50.0)
Hemoglobin: 14.4 g/dL (ref 13.2–17.1)
Lymphs Abs: 2730 cells/uL (ref 850–3900)
MCH: 29.7 pg (ref 27.0–33.0)
MCHC: 34.5 g/dL (ref 32.0–36.0)
MCV: 86 fL (ref 80.0–100.0)
MPV: 10 fL (ref 7.5–12.5)
Monocytes Relative: 6.7 %
Neutro Abs: 4738 cells/uL (ref 1500–7800)
Neutrophils Relative %: 56.4 %
Platelets: 225 10*3/uL (ref 140–400)
RBC: 4.85 10*6/uL (ref 4.20–5.80)
RDW: 13.4 % (ref 11.0–15.0)
Total Lymphocyte: 32.5 %
WBC mixed population: 563 cells/uL (ref 200–950)
WBC: 8.4 10*3/uL (ref 3.8–10.8)

## 2017-07-06 LAB — BASIC METABOLIC PANEL WITH GFR
BUN: 17 mg/dL (ref 7–25)
CO2: 29 mmol/L (ref 20–32)
Calcium: 9.6 mg/dL (ref 8.6–10.3)
Chloride: 105 mmol/L (ref 98–110)
Creat: 1.01 mg/dL (ref 0.60–1.35)
GFR, Est African American: 104 mL/min/{1.73_m2} (ref >=60)
GFR, Est Non African American: 90 mL/min/{1.73_m2} (ref >=60)
Glucose, Bld: 88 mg/dL (ref 65–99)
Potassium: 4.4 mmol/L (ref 3.5–5.3)
Sodium: 140 mmol/L (ref 135–146)

## 2017-07-06 LAB — VITAMIN D 25 HYDROXY (VIT D DEFICIENCY, FRACTURES): Vit D, 25-Hydroxy: 28 ng/mL — ABNORMAL LOW (ref 30–100)

## 2017-07-06 LAB — HEMOGLOBIN A1C
Hgb A1c MFr Bld: 5.5 % of total Hgb (ref <5.7)
Mean Plasma Glucose: 111 (calc)
eAG (mmol/L): 6.2 (calc)

## 2017-07-06 LAB — HEPATIC FUNCTION PANEL
AG Ratio: 1.6 (calc) (ref 1.0–2.5)
ALT: 39 U/L (ref 9–46)
AST: 24 U/L (ref 10–40)
Albumin: 4.2 g/dL (ref 3.6–5.1)
Alkaline phosphatase (APISO): 93 U/L (ref 40–115)
Bilirubin, Direct: 0.1 mg/dL (ref 0.0–0.2)
Globulin: 2.6 g/dL (calc) (ref 1.9–3.7)
Indirect Bilirubin: 0.4 mg/dL (calc) (ref 0.2–1.2)
Total Bilirubin: 0.5 mg/dL (ref 0.2–1.2)
Total Protein: 6.8 g/dL (ref 6.1–8.1)

## 2017-07-06 LAB — LIPID PANEL
Cholesterol: 163 mg/dL (ref <200)
HDL: 39 mg/dL — ABNORMAL LOW (ref >40)
LDL Cholesterol (Calc): 95 mg/dL (calc)
Non-HDL Cholesterol (Calc): 124 mg/dL (calc) (ref <130)
Total CHOL/HDL Ratio: 4.2 (calc) (ref <5.0)
Triglycerides: 194 mg/dL — ABNORMAL HIGH (ref <150)

## 2017-07-06 LAB — TSH: TSH: 1.63 mIU/L (ref 0.40–4.50)

## 2017-07-30 ENCOUNTER — Encounter: Payer: Self-pay | Admitting: Physician Assistant

## 2017-09-14 ENCOUNTER — Ambulatory Visit: Payer: BLUE CROSS/BLUE SHIELD | Admitting: Adult Health

## 2017-09-14 ENCOUNTER — Encounter: Payer: Self-pay | Admitting: Adult Health

## 2017-09-14 ENCOUNTER — Encounter: Payer: Self-pay | Admitting: Physician Assistant

## 2017-09-14 VITALS — BP 122/80 | HR 71 | Temp 97.7°F | Ht 69.0 in | Wt 315.0 lb

## 2017-09-14 DIAGNOSIS — M25511 Pain in right shoulder: Secondary | ICD-10-CM | POA: Diagnosis not present

## 2017-09-14 MED ORDER — MELOXICAM 15 MG PO TABS: ORAL_TABLET | ORAL | 1 refills | Status: DC

## 2017-09-14 NOTE — Progress Notes (Signed)
Assessment and Plan:  Mike Kramer was seen today for shoulder pain.  Diagnoses and all orders for this visit:  Acute pain of right shoulder Simple tendinopathy vs bursitis Recommended rest, ice, NSAID x 2 weeks then PRN Advised can take ~12 weeks of rest to fully resolve Can try steroid taper if not improving in 2 weeks on NSAID Refer to ortho if not resolving ~12 weeks  -     meloxicam (MOBIC) 15 MG tablet; Take one daily with food for 2 weeks, can take with tylenol, can not take with aleve, iburpofen, then as needed daily for pain  Further disposition pending results of labs. Discussed med's effects and SE's.   Over 15 minutes of exam, counseling, chart review, and critical decision making was performed.   Future Appointments  Date Time Provider Department Center  10/14/2017  3:00 PM Lucky Cowboy, MD [facility] None    ------------------------------------------------------------------------------------------------------------------   HPI BP 122/80   Pulse 71   Temp 97.7 F (36.5 C)   Ht  (1.753 m)   Wt (!) 315 lb (142.9 kg)   SpO2 94%   BMI 46.52 kg/m   47 y.o.right handed Caucasian obese male presents for right shoulder pain, anteriolateral, aching, 6/10 at baseline, aggravated by abduction past 90 degrees, pain ~8/10 with vertical abduction and reports rare brief radiation down shoulder/upper arm. Pain is worse with palpation. Denies known injury, onset was gradual. He has tried taking 800 mg ibuprofen for this intermittently which does help significantly. He works at [employer] job and has not interfered with ability to work. He reports pain was worst with he tried to go fishing recently. He denies "catching" or "clunking" sensation, numbness, tingling, swelling at home.   No past medical history on file.   Allergies  Allergen Reactions  . Penicillins Hives    Childhood allergy    Current Outpatient Medications on File Prior to Visit  Medication Sig  . atorvastatin  (LIPITOR) 80 MG tablet TAKE 1/2 TO 1 TABLET DAILY OR AS DIRECTED FOR CHOLESTEROL (Patient taking differently: Take 1/2 tablet by mouth every other day)  . cholecalciferol (VITAMIN D) 1000 units tablet Take 1 tablet (1,000 Units total) by mouth daily.  Marland Kitchen ibuprofen (ADVIL,MOTRIN) 800 MG tablet Take 800 mg by mouth every 8 (eight) hours as needed.  . sertraline (ZOLOFT) 100 MG tablet TAKE 1 TABLET BY MOUTH DAILY - PT OVERDUE TO OFFICE VISIT  . bisoprolol-hydrochlorothiazide (ZIAC) 5-6.25 MG tablet Take 1 tablet daily for BP - Need Office Visit before further refills (Patient not taking: Reported on 07/05/2017)   No current facility-administered medications on file prior to visit.     ROS: all negative except above.   Physical Exam:  BP 122/80   Pulse 71   Temp 97.7 F (36.5 C)   Ht  (1.753 m)   Wt (!) 315 lb (142.9 kg)   SpO2 94%   BMI 46.52 kg/m   General Appearance: Well nourished, in no apparent distress. Neck: Supple.  Respiratory: Respiratory effort normal, BS equal bilaterally without rales, rhonchi, wheezing or stridor.  Cardio: RRR with no MRGs..  Abdomen: Soft, + BS.  Non tender, no guarding, rebound, hernias, masses. Lymphatics: Non tender without lymphadenopathy.  Musculoskeletal: Full ROM passive and active, no palpable abnormality to right shoulder, no clicking or catching, no effusion, injection, 5/5 strength, normal gait. Mildly tender with palpation at junction of acromion and deltoid laterally, biceps tendon not notably tender Skin: Warm, dry without rashes, lesions, ecchymosis.  Neuro: Cranial nerves intact. Normal muscle tone. Sensation intact.  Psych: Awake and oriented X 3, normal affect, Insight and Judgment appropriate.     Dan Maker, NP 4:45 PM [facility]

## 2017-09-14 NOTE — Patient Instructions (Signed)
Rest, ice, mobic x 2 weeks daily then as needed  Call if not improving in 2 weeks  If not better in 12 will send to ortho    Bursitis Bursitis is inflammation and irritation of a bursa, which is one of the small, fluid-filled sacs that cushion and protect the moving parts of your body. These sacs are located between bones and muscles, muscle attachments, or skin areas next to bones. A bursa protects these structures from the wear and tear that results from frequent movement. An inflamed bursa causes pain and swelling. Fluid may build up inside the sac. Bursitis is most common near joints, especially the knees, elbows, hips, and shoulders. What are the causes? Bursitis can be caused by:  Injury from: ? A direct blow, like falling on your knee or elbow. ? Overuse of a joint (repetitive stress).  Infection. This can happen if bacteria gets into a bursa through a cut or scrape near a joint.  Diseases that cause joint inflammation, such as gout and rheumatoid arthritis.  What increases the risk? You may be at risk for bursitis if you:  Have a job or hobby that involves a lot of repetitive stress on your joints.  Have a condition that weakens your body's defense system (immune system), such as diabetes, cancer, or HIV.  Lift and reach overhead often.  Kneel or lean on hard surfaces often.  Run or walk often.  What are the signs or symptoms? The most common signs and symptoms of bursitis are:  Pain that gets worse when you move the affected body part or put weight on it.  Inflammation.  Stiffness.  Other signs and symptoms may include:  Redness.  Tenderness.  Warmth.  Pain that continues after rest.  Fever and chills. This may occur in bursitis caused by infection.  How is this diagnosed? Bursitis may be diagnosed by:  Medical history and physical exam.  MRI.  A procedure to drain fluid from the bursa with a needle (aspiration). The fluid may be checked for  signs of infection or gout.  Blood tests to rule out other causes of inflammation.  How is this treated? Bursitis can usually be treated at home with rest, ice, compression, and elevation (RICE). For mild bursitis, RICE treatment may be all you need. Other treatments may include:  Nonsteroidal anti-inflammatory drugs (NSAIDs) to treat pain and inflammation.  Corticosteroids to fight inflammation. You may have these drugs injected into and around the area of bursitis.  Aspiration of bursitis fluid to relieve pain and improve movement.  Antibiotic medicine to treat an infected bursa.  A splint, brace, or walking aid.  Physical therapy if you continue to have pain or limited movement.  Surgery to remove a damaged or infected bursa. This may be needed if you have a very bad case of bursitis or if other treatments have not worked.  Follow these instructions at home:  Take medicines only as directed by your health care provider.  If you were prescribed an antibiotic medicine, finish it all even if you start to feel better.  Rest the affected area as directed by your health care provider. ? Keep the area elevated. ? Avoid activities that make pain worse.  Apply ice to the injured area: ? Place ice in a plastic bag. ? Place a towel between your skin and the bag. ? Leave the ice on for 20 minutes, 2-3 times a day.  Use splints, braces, pads, or walking aids as directed by your  health care provider.  Keep all follow-up visits as directed by your health care provider. This is important. How is this prevented?  Wear knee pads if you kneel often.  Wear sturdy running or walking shoes that fit you well.  Take regular breaks from repetitive activity.  Warm up by stretching before doing any strenuous activity.  Maintain a healthy weight or lose weight as recommended by your health care provider. Ask your health care provider if you need help.  Exercise regularly. Start any new  physical activity gradually. Contact a health care provider if:  Your bursitis is not responding to treatment or home care.  You have a fever.  You have chills. This information is not intended to replace advice given to you by your health care provider. Make sure you discuss any questions you have with your health care provider. Document Released: 04/24/2000 Document Revised: 10/03/2015 Document Reviewed: 07/17/2013 Elsevier Interactive Patient Education  2018 [employer].

## 2017-09-29 ENCOUNTER — Other Ambulatory Visit: Payer: Self-pay | Admitting: Internal Medicine

## 2017-10-11 ENCOUNTER — Other Ambulatory Visit: Payer: Self-pay | Admitting: Internal Medicine

## 2017-10-11 ENCOUNTER — Encounter: Payer: Self-pay | Admitting: Physician Assistant

## 2017-10-11 DIAGNOSIS — E785 Hyperlipidemia, unspecified: Secondary | ICD-10-CM

## 2017-10-11 MED ORDER — ATORVASTATIN CALCIUM 80 MG PO TABS: ORAL_TABLET | ORAL | 0 refills | Status: DC

## 2017-10-14 ENCOUNTER — Encounter: Payer: Self-pay | Admitting: Internal Medicine

## 2017-11-22 ENCOUNTER — Encounter: Payer: Self-pay | Admitting: Internal Medicine

## 2017-11-29 ENCOUNTER — Encounter: Payer: Self-pay | Admitting: Adult Health

## 2017-12-17 NOTE — Progress Notes (Signed)
Complete Physical  Assessment and Plan:  Mike Kramer was seen today for annual exam.  Diagnoses and all orders for this visit:  Encounter for routine adult health examination without abnormal findings  Essential hypertension Controlled off of medication Monitor blood pressure at home; call if consistently over 130/80 Continue DASH diet.   Reminder to go to the ER if any CP, SOB, nausea, dizziness, severe HA, changes vision/speech, left arm numbness and tingling and jaw pain. -     COMPLETE METABOLIC PANEL WITH GFR -     TSH -     Urinalysis w microscopic + reflex cultur -     Microalbumin / creatinine urine ratio -     EKG 12-Lead  OSA (obstructive sleep apnea) Wears CPAP, 100%, working well   Vitamin D deficiency Forgets to take, has been off for a while, defer checking levels today, can take higher dose on days that he takes cholesterol medication, discussed goal of ~70, benefits associated with this  Other abnormal glucose Discussed disease and risks Discussed diet/exercise, weight management  A1C -     Hemoglobin A1c  Morbid obesity (HCC) Long discussion about weight loss, diet, and exercise Recommended diet heavy in fruits and veggies and low in animal meats, cheeses, and dairy products, appropriate calorie intake Discussed appropriate weight for height - current end goal is 200 lb Continue with lifestyle modification, discussed will hit plateau, discussed strategies, can follow up and discuss medications if needed.   Follow up at next visit in 6 months  Mixed hyperlipidemia Continue medications: atorvastatin 40 mg every other day Continue low cholesterol diet and exercise.  Check lipid panel.  -     Lipid panel -     TSH  Anxiety Well managed by current regimen; continue medications Stress management techniques discussed, increase water, good sleep hygiene discussed, increase exercise, and increase veggies.  -     TSH  Medication management -     CBC with  Differential/Platelet -     COMPLETE METABOLIC PANEL WITH GFR -     Magnesium -     Urinalysis w microscopic + reflex cultur  Screening for prostate cancer -     PSA  Discussed med's effects and SE's. Screening labs and tests as requested with regular follow-up as recommended.  Future Appointments  Date Time Provider Department Center  12/22/2018 10:00 AM Mike Gaudier, NP [facility] None     HPI Patient presents for a complete physical. He has Labile HTN; Morbid obesity (HCC); Hyperlipidemia; Vitamin D deficiency; OSA (obstructive sleep apnea); Other abnormal glucose; and Anxiety on their problem list. He is a Human resources officer.   He is on zoloft 100 mg daily for mood/OCD and feels he is doing well on this dose.   BMI is Body mass index is 44.95 kg/m., he has been working on diet and exercise, pushing water, watching portions. He is aiming to get to 200 lb or so.  Wt Readings from Last 3 Encounters:  12/20/17 (!) 304 lb 6.4 oz (138.1 kg)  09/14/17 (!) 315 lb (142.9 kg)  07/05/17 (!) 309 lb (140.2 kg)   His blood pressure has been controlled at home, today their BP is BP: 110/80 He does workout. He denies chest pain, shortness of breath, dizziness.   He is on cholesterol medication (atorvastatin 40 mg daily every other day) and denies myalgias. His LDL cholesterol is at goal. The cholesterol last visit was:   Lab Results  Component Value Date   CHOL  163 07/05/2017   HDL 39 (L) 07/05/2017   LDLCALC 95 07/05/2017   TRIG 194 (H) 07/05/2017   CHOLHDL 4.2 07/05/2017   He reports that he is struggling with healthy diet.  He reports that it is mostly time constraints.  Last A1C in the office was:  Lab Results  Component Value Date   HGBA1C 5.5 07/05/2017   Patient is on Vitamin D supplement.   Lab Results  Component Value Date   VD25OH 28 (L) 07/05/2017   Last PSA was: Lab Results  Component Value Date   PSA 0.2 02/20/2016  .  Denies BPH symptoms daytime frequency, double  voiding, dysuria, hematuria, hesitancy, incontinence, intermittency, nocturia, sensation of incomplete bladder emptying, suprapubic pain, urgency or weak urinary stream.   Current Medications:  Current Outpatient Medications on File Prior to Visit  Medication Sig Dispense Refill  . atorvastatin (LIPITOR) 80 MG tablet TAKE 1/2 TO 1 TABLET DAILY OR AS DIRECTED FOR CHOLESTEROL 90 tablet 0  . cholecalciferol (VITAMIN D) 1000 units tablet Take 1 tablet (1,000 Units total) by mouth daily.    . sertraline (ZOLOFT) 100 MG tablet TAKE 1 TABLET BY MOUTH DAILY - PT OVERDUE TO OFFICE VISIT 90 tablet 1  . bisoprolol-hydrochlorothiazide (ZIAC) 5-6.25 MG tablet TAKE 1 TABLET BY MOUTH EVERY DAY FOR BLOOD PRESSURE 90 tablet 0   No current facility-administered medications on file prior to visit.     Health Maintenance:  Immunization History  Administered Date(s) Administered  . Influenza,inj,quad, With Preservative 02/20/2016  . Tetanus 02/20/2016    Tetanus: 02/20/16 Flu vaccine: 2017  Eye Exam:  [place], ? Dr. , last visit 2019, wears glasses, goes annually Dentist:  Mike Kramer, Twice yearly visits, 2019   Patient Care Team: Mike Kramer, Mike Kramer as PCP - General (Internal Medicine)  Allergies:  Allergies  Allergen Reactions  . Penicillins Hives    Childhood allergy    Medical History: No past medical history on file.  Surgical History:  Past Surgical History:  Procedure Laterality Date  . KNEE SURGERY Right 1991   arthroscopic, "cleaning out"   . ORIF FINGER FRACTURE Right 2017   right pinky  . VASECTOMY  2014   ? Dr. Vernie Kramer    Family History:  Family History  Problem Relation Age of Onset  . Skin cancer Father 6160       ? melanoma  . Cancer Paternal Grandfather     Social History:   Social History   Tobacco Use  . Smoking status: Never Smoker  . Smokeless tobacco: Never Used  Substance Use Topics  . Alcohol use: Yes    Alcohol/week: 0.0 standard drinks     Comment: occasional  . Drug use: No    Review of Systems:  Review of Systems  Constitutional: Negative for malaise/fatigue and weight loss.  HENT: Negative for hearing loss and tinnitus.   Eyes: Negative for blurred vision and double vision.  Respiratory: Negative for cough, sputum production, shortness of breath and wheezing.   Cardiovascular: Negative for chest pain, palpitations, orthopnea, claudication, leg swelling and PND.  Gastrointestinal: Negative for abdominal pain, blood in stool, constipation, diarrhea, heartburn, melena, nausea and vomiting.  Genitourinary: Negative.   Musculoskeletal: Negative for joint pain and myalgias.  Skin: Negative for rash.  Neurological: Negative for dizziness, tingling, sensory change, weakness and headaches.  Endo/Heme/Allergies: Negative for polydipsia.  Psychiatric/Behavioral: Negative.  Negative for depression, memory loss, substance abuse and suicidal ideas. The patient is not nervous/anxious and does not  have insomnia.   All other systems reviewed and are negative.   Physical Exam: Estimated body mass index is 44.95 kg/m as calculated from the following:   Height as of this encounter: 5\' 9"  (1.753 m).   Weight as of this encounter: 304 lb 6.4 oz (138.1 kg). BP 110/80   Pulse (!) 49   Temp (!) 97.3 F (36.3 C)   Ht 5\' 9"  (1.753 m)   Wt (!) 304 lb 6.4 oz (138.1 kg)   SpO2 96%   BMI 44.95 kg/m   General Appearance: Well nourished, in no apparent distress.  Eyes: PERRLA, EOMs, conjunctiva no swelling or erythema ENT/Mouth: Ear canals clear bilaterally with no erythema, swelling, discharge.  TMs normal bilaterally with no erythema, bulging, or retractions.  Oropharynx clear and moist with no exudate, swelling, or erythema.  Dentition normal.   Neck: Supple, thyroid normal. No bruits, JVD, cervical adenopathy Respiratory: Respiratory effort normal, BS equal bilaterally without rales, rhonchi, wheezing or stridor.  Cardio: RRR without  murmurs, rubs or gallops. Brisk peripheral pulses without edema.  Chest: symmetric, with normal excursions Abdomen: Soft, nontender, no guarding, rebound, hernias, masses, or organomegaly. Genitourinary:  Musculoskeletal: Full ROM all peripheral extremities,5/5 strength, and normal gait.  Skin: Warm, dry without rashes, lesions, ecchymosis. Neuro: A&Ox3, Cranial nerves intact, reflexes equal bilaterally. Normal muscle tone, no cerebellar symptoms. Sensation intact.  Psych: Normal affect, Insight and Judgment appropriate.   EKG: Sinus brady (per patient he has "always" had this) no changes.  AORTA SCAN: defer  Over 40 minutes of exam, counseling, chart review and critical decision making was performed  Dan Maker 10:25 AM [facility]

## 2017-12-20 ENCOUNTER — Ambulatory Visit: Payer: BLUE CROSS/BLUE SHIELD | Admitting: Adult Health

## 2017-12-20 ENCOUNTER — Encounter: Payer: Self-pay | Admitting: Adult Health

## 2017-12-20 VITALS — BP 110/80 | HR 49 | Temp 97.3°F | Ht 69.0 in | Wt 304.4 lb

## 2017-12-20 DIAGNOSIS — R7309 Other abnormal glucose: Secondary | ICD-10-CM | POA: Diagnosis not present

## 2017-12-20 DIAGNOSIS — I10 Essential (primary) hypertension: Secondary | ICD-10-CM

## 2017-12-20 DIAGNOSIS — R001 Bradycardia, unspecified: Secondary | ICD-10-CM

## 2017-12-20 DIAGNOSIS — Z79899 Other long term (current) drug therapy: Secondary | ICD-10-CM

## 2017-12-20 DIAGNOSIS — F419 Anxiety disorder, unspecified: Secondary | ICD-10-CM

## 2017-12-20 DIAGNOSIS — Z0000 Encounter for general adult medical examination without abnormal findings: Secondary | ICD-10-CM | POA: Diagnosis not present

## 2017-12-20 DIAGNOSIS — Z125 Encounter for screening for malignant neoplasm of prostate: Secondary | ICD-10-CM | POA: Diagnosis not present

## 2017-12-20 DIAGNOSIS — E782 Mixed hyperlipidemia: Secondary | ICD-10-CM

## 2017-12-20 DIAGNOSIS — G4733 Obstructive sleep apnea (adult) (pediatric): Secondary | ICD-10-CM

## 2017-12-20 DIAGNOSIS — E559 Vitamin D deficiency, unspecified: Secondary | ICD-10-CM

## 2017-12-20 NOTE — Patient Instructions (Signed)
Try taking vitamin D 5000 IU on days that you take your cholesterol medicine - can double up if forgetting to take  If you reach a plateau - try mixing things up; body will adjust to lifestyle changes and typically will hit a plateau every 15-30 lb - this is NORMAL - call or come see me if you are struggling    Know what a healthy weight is for you (roughly BMI <25) and aim to maintain this  Aim for 7+ servings of fruits and vegetables daily  65-80+ fluid ounces of water or unsweet tea for healthy kidneys  Limit to max 1 drink of alcohol per day; avoid smoking/tobacco  Limit animal fats in diet for cholesterol and heart health - choose grass fed whenever available  Avoid highly processed foods, and foods high in saturated/trans fats  Aim for low stress - take time to unwind and care for your mental health  Aim for 150 min of moderate intensity exercise weekly for heart health, and weights twice weekly for bone health  Aim for 7-9 hours of sleep daily   We want weight loss that will last so you should lose 1-2 pounds a week.  THAT IS IT! Please pick THREE things a month to change. Once it is a habit check off the item. Then pick another three items off the list to become habits.  If you are already doing a habit on the list GREAT!  Cross that item off! o Don't drink your calories. Ie, alcohol, soda, fruit juice, and sweet tea.  o Drink more water. Drink a glass when you feel hungry or before each meal.  o Eat breakfast - Complex carb and protein (likeDannon light and fit yogurt, oatmeal, fruit, eggs, [place]urkey bacon). o Measure your cereal.  Eat no more than one cup a day. (ie [place]ashi) o Eat an apple a day. o Add a vegetable a day. o Try a new vegetable a month. o Use Pam! Stop using oil or butter to cook. o Don't finish your plate or use smaller plates. o Share your dessert. o Eat sugar free Jello for dessert or frozen grapes. o Don't eat 2-3 hours before bed. o Switch to whole wheat  bread, pasta, and brown rice. o Make healthier choices when you eat out. No fries! o Pick baked chicken, NOT fried. o Don't forget to SLOW DOWN when you eat. It is not going anywhere.  o Take the stairs. o Park far away in the parking lot o [employer] Farm[employer] soup cans (or weights) for 10 minutes while watching TV. o Walk at work for 10 minutes during break. o Walk outside 1 time a week with your friend, kids, dog, or significant other. o Start a walking group at church. o Walk the mall as much as you can tolerate.  o Keep a food diary. o Weigh yourself daily. o Walk for 15 minutes 3 days per week. o Cook at home more often and eat out less.  If life happens and you go back to old habits, it is okay.  Just start over. You can do it!   If you experience chest pain, get short of breath, or tired during the exercise, please stop immediately and inform your doctor.

## 2017-12-21 ENCOUNTER — Other Ambulatory Visit: Payer: Self-pay | Admitting: Adult Health

## 2017-12-21 LAB — HEMOGLOBIN A1C
Hgb A1c MFr Bld: 5.4 % of total Hgb (ref <5.7)
Mean Plasma Glucose: 108 (calc)
eAG (mmol/L): 6 (calc)

## 2017-12-21 LAB — COMPLETE METABOLIC PANEL WITH GFR
AG Ratio: 1.8 (calc) (ref 1.0–2.5)
ALT: 30 U/L (ref 9–46)
AST: 23 U/L (ref 10–40)
Albumin: 4.4 g/dL (ref 3.6–5.1)
Alkaline phosphatase (APISO): 79 U/L (ref 40–115)
BUN: 12 mg/dL (ref 7–25)
CO2: 25 mmol/L (ref 20–32)
Calcium: 9.5 mg/dL (ref 8.6–10.3)
Chloride: 106 mmol/L (ref 98–110)
Creat: 0.9 mg/dL (ref 0.60–1.35)
GFR, Est African American: 120 mL/min/{1.73_m2} (ref >=60)
GFR, Est Non African American: 104 mL/min/{1.73_m2} (ref >=60)
Globulin: 2.5 g/dL (calc) (ref 1.9–3.7)
Glucose, Bld: 93 mg/dL (ref 65–99)
Potassium: 4.4 mmol/L (ref 3.5–5.3)
Sodium: 139 mmol/L (ref 135–146)
Total Bilirubin: 1 mg/dL (ref 0.2–1.2)
Total Protein: 6.9 g/dL (ref 6.1–8.1)

## 2017-12-21 LAB — MICROALBUMIN / CREATININE URINE RATIO
Creatinine, Urine: 34 mg/dL (ref 20–320)
Microalb, Ur: <0.2 mg/dL

## 2017-12-21 LAB — CBC WITH DIFFERENTIAL/PLATELET
Basophils Absolute: 51 cells/uL (ref 0–200)
Basophils Relative: 0.9 %
Eosinophils Absolute: 160 cells/uL (ref 15–500)
Eosinophils Relative: 2.8 %
HCT: 43.5 % (ref 38.5–50.0)
Hemoglobin: 14.8 g/dL (ref 13.2–17.1)
Lymphs Abs: 2183 cells/uL (ref 850–3900)
MCH: 30.5 pg (ref 27.0–33.0)
MCHC: 34 g/dL (ref 32.0–36.0)
MCV: 89.7 fL (ref 80.0–100.0)
MPV: 10.8 fL (ref 7.5–12.5)
Monocytes Relative: 6.2 %
Neutro Abs: 2953 cells/uL (ref 1500–7800)
Neutrophils Relative %: 51.8 %
Platelets: 205 10*3/uL (ref 140–400)
RBC: 4.85 10*6/uL (ref 4.20–5.80)
RDW: 13.2 % (ref 11.0–15.0)
Total Lymphocyte: 38.3 %
WBC mixed population: 353 cells/uL (ref 200–950)
WBC: 5.7 10*3/uL (ref 3.8–10.8)

## 2017-12-21 LAB — URINALYSIS W MICROSCOPIC + REFLEX CULTURE
Bacteria, UA: NONE SEEN /HPF
Bilirubin Urine: NEGATIVE
Glucose, UA: NEGATIVE
Hgb urine dipstick: NEGATIVE
Hyaline Cast: NONE SEEN /LPF
Ketones, ur: NEGATIVE
Leukocyte Esterase: NEGATIVE
Nitrites, Initial: NEGATIVE
Protein, ur: NEGATIVE
RBC / HPF: NONE SEEN /HPF (ref 0–2)
Specific Gravity, Urine: 1.006 (ref 1.001–1.03)
Squamous Epithelial / LPF: NONE SEEN /HPF (ref <=5)
WBC, UA: NONE SEEN /HPF (ref 0–5)
pH: 6 (ref 5.0–8.0)

## 2017-12-21 LAB — LIPID PANEL
Cholesterol: 117 mg/dL (ref <200)
HDL: 35 mg/dL — ABNORMAL LOW (ref >40)
LDL Cholesterol (Calc): 63 mg/dL (calc)
Non-HDL Cholesterol (Calc): 82 mg/dL (calc) (ref <130)
Total CHOL/HDL Ratio: 3.3 (calc) (ref <5.0)
Triglycerides: 111 mg/dL (ref <150)

## 2017-12-21 LAB — NO CULTURE INDICATED

## 2017-12-21 LAB — VITAMIN D 25 HYDROXY (VIT D DEFICIENCY, FRACTURES): Vit D, 25-Hydroxy: 36 ng/mL (ref 30–100)

## 2017-12-21 LAB — TSH: TSH: 1.24 mIU/L (ref 0.40–4.50)

## 2017-12-21 LAB — PSA: PSA: 0.2 ng/mL (ref <=4.0)

## 2017-12-21 LAB — MAGNESIUM: Magnesium: 2 mg/dL (ref 1.5–2.5)

## 2017-12-21 MED ORDER — VITAMIN D 50 MCG (2000 UT) PO TABS: 2000.0000 [IU] | ORAL_TABLET | Freq: Every day | ORAL | Status: DC

## 2017-12-26 ENCOUNTER — Other Ambulatory Visit: Payer: Self-pay | Admitting: Adult Health

## 2017-12-26 DIAGNOSIS — F32A Depression, unspecified: Secondary | ICD-10-CM

## 2017-12-26 DIAGNOSIS — F329 Major depressive disorder, single episode, unspecified: Secondary | ICD-10-CM

## 2017-12-28 ENCOUNTER — Encounter: Payer: Self-pay | Admitting: Adult Health

## 2018-03-11 ENCOUNTER — Encounter: Payer: Self-pay | Admitting: Adult Health

## 2018-03-22 ENCOUNTER — Encounter: Payer: Self-pay | Admitting: Adult Health

## 2018-03-31 ENCOUNTER — Encounter: Payer: Self-pay | Admitting: Adult Health

## 2018-06-15 ENCOUNTER — Other Ambulatory Visit: Payer: Self-pay | Admitting: Adult Health

## 2018-06-15 MED ORDER — OSELTAMIVIR PHOSPHATE 75 MG PO CAPS: 75.0000 mg | ORAL_CAPSULE | Freq: Every day | ORAL | 0 refills | Status: AC

## 2018-06-23 ENCOUNTER — Ambulatory Visit: Payer: Self-pay | Admitting: Adult Health

## 2018-06-26 ENCOUNTER — Other Ambulatory Visit: Payer: Self-pay | Admitting: Adult Health

## 2018-06-26 DIAGNOSIS — F329 Major depressive disorder, single episode, unspecified: Secondary | ICD-10-CM

## 2018-06-26 DIAGNOSIS — F32A Depression, unspecified: Secondary | ICD-10-CM

## 2018-07-06 ENCOUNTER — Encounter: Payer: Self-pay | Admitting: Adult Health

## 2018-07-06 ENCOUNTER — Ambulatory Visit: Payer: BLUE CROSS/BLUE SHIELD | Admitting: Adult Health

## 2018-07-06 VITALS — BP 130/90 | HR 67 | Temp 97.3°F | Ht 69.0 in | Wt 306.0 lb

## 2018-07-06 DIAGNOSIS — J302 Other seasonal allergic rhinitis: Secondary | ICD-10-CM | POA: Diagnosis not present

## 2018-07-06 MED ORDER — PREDNISONE 20 MG PO TABS: ORAL_TABLET | ORAL | 0 refills | Status: DC

## 2018-07-06 MED ORDER — FLUTICASONE PROPIONATE 50 MCG/ACT NA SUSP: 1.0000 | Freq: Every day | NASAL | 2 refills | Status: DC

## 2018-07-06 NOTE — Progress Notes (Signed)
Assessment and Plan:  Mike Kramer was seen today for acute visit.  Diagnoses and all orders for this visit:  Seasonal allergic rhinitis, unspecified trigger Discussed the importance of avoiding unnecessary antibiotic therapy. allergy hygiene explained. Suggested symptomatic OTC remedies. Nasal saline spray for congestion. Nasal steroids, continue OTC allergy pill, oral steroids offered Can try astalin nasal spray next if steroids not helping Follow up as needed. -     predniSONE (DELTASONE) 20 MG tablet; 2 tablets daily for 3 days, 1 tablet daily for 4 days. -     fluticasone (FLONASE) 50 MCG/ACT nasal spray; Place 1 spray into both nostrils daily.  Further disposition pending results of labs. Discussed med's effects and SE's.   Over 15 minutes of exam, counseling, chart review, and critical decision making was performed.   Future Appointments  Date Time Provider Department Center  07/26/2018  4:30 PM [name], NP [facility] None  12/22/2018 10:00 AM [name], NP [facility] None    ------------------------------------------------------------------------------------------------------------------   HPI BP 130/90   Pulse 67   Temp (!) 97.3 F (36.3 C)   Ht 5\' 9"  (1.753 m)   Wt (!) 306 lb (138.8 kg)   SpO2 97%   BMI 45.19 kg/m   47 y.o.male with OSA, morbid obesity, labile htn presents for 3 weeks of nasal congestion/running, and watery/itchy eyes. Thought this was allergies (though denies history of this) and reports he tried advil sinus and zyrtec D, thinks zyrtec did help some. He does endorse mild sinus/facial pressure.   Denies sore throat, fever/chills, cough, sneezing, chest congestion, n/v/d, headaches, ear pain/pressure.    No past medical history on file.   Allergies  Allergen Reactions  . Penicillins Hives    Childhood allergy    Current Outpatient Medications on File Prior to Visit  Medication Sig  . atorvastatin (LIPITOR) 80 MG tablet TAKE 1/2 TO 1  TABLET DAILY OR AS DIRECTED FOR CHOLESTEROL  . cholecalciferol 2000 units tablet Take 1 tablet (2,000 Units total) by mouth daily.  . sertraline (ZOLOFT) 100 MG tablet TAKE 1 TABLET BY MOUTH DAILY - PT OVERDUE TO OFFICE VISIT (Patient taking differently: TAKE 1 TABLET BY MOUTH DAILY)   No current facility-administered medications on file prior to visit.     ROS: all negative except above.   Physical Exam:  BP 130/90   Pulse 67   Temp (!) 97.3 F (36.3 C)   Ht 5\' 9"  (1.753 m)   Wt (!) 306 lb (138.8 kg)   SpO2 97%   BMI 45.19 kg/m   General Appearance: Well nourished, in no apparent distress. Eyes: PERRLA, conjunctiva no swelling or erythema Sinuses: No Frontal/maxillary tenderness ENT/Mouth: Ext aud canals clear, TMs without erythema, bulging. No erythema, swelling, or exudate on post pharynx.  Tonsils not swollen or erythematous. Hearing normal.  Neck: Supple, thyroid normal.  Respiratory: Respiratory effort normal, BS equal bilaterally without rales, rhonchi, wheezing or stridor.  Cardio: RRR with no MRGs. Brisk peripheral pulses without edema.  Abdomen: Soft, + BS.  Non tender Lymphatics: Non tender without lymphadenopathy.  Musculoskeletal: Symemtrical strength, normal gait.  Skin: Warm, dry without rashes, lesions, ecchymosis.  Neuro: Cranial nerves intact. Normal muscle tone, no cerebellar symptoms.   Psych: Awake and oriented X 3, normal affect, Insight and Judgment appropriate.     Dan Maker, NP 2:46 PM [facility]

## 2018-07-06 NOTE — Patient Instructions (Addendum)
Medicines you can use  Nasal congestion  Little Remedies saline spray (aerosol/mist)- can try this, it is in the kids section - pseudoephedrine (Sudafed)- behind the counter, do not use if you have high blood pressure, medicine that have -D in them.  - phenylephrine (Sudafed PE) -Dextormethorphan + chlorpheniramine (Coridcidin HBP)- okay if you have high blood pressure -Oxymetazoline (Afrin) nasal spray- LIMIT to 3 days -Saline nasal spray -Neti pot (used distilled or bottled water)  Ear pain/congestion  -pseudoephedrine (sudafed) - Nasonex/flonase nasal spray  Fever  -Acetaminophen (Tyelnol) -Ibuprofen (Advil, motrin, aleve)  Sore Throat  -Acetaminophen (Tyelnol) -Ibuprofen (Advil, motrin, aleve) -Drink a lot of water -Gargle with salt water - Rest your voice (don't talk) -Throat sprays -Cough drops  Body Aches  -Acetaminophen (Tyelnol) -Ibuprofen (Advil, motrin, aleve)  Headache  -Acetaminophen (Tyelnol) -Ibuprofen (Advil, motrin, aleve) - Exedrin, Exedrin Migraine  Allergy symptoms (cough, sneeze, runny nose, itchy eyes) -Claritin or loratadine cheapest but likely the weakest  -Zyrtec or certizine at night because it can make you sleepy -The strongest is allegra or fexafinadine  Cheapest at [employer], [employer], [employer]  Cough  -Dextromethorphan (Delsym)- medicine that has DM in it -Guafenesin (Mucinex/Robitussin) - cough drops - drink lots of water  Chest Congestion  -Guafenesin (Mucinex/Robitussin)  Red Itchy Eyes  - Naphcon-A  Upset Stomach  - Bland diet (nothing spicy, greasy, fried, and high acid foods like tomatoes, oranges, berries) -OKAY- cereal, bread, soup, crackers, rice -Eat smaller more frequent meals -reduce caffeine, no alcohol -Loperamide (Imodium-AD) if diarrhea -Prevacid for heart burn  General health when sick  -Hydration -wash your hands frequently -keep surfaces clean -change pillow cases and sheets often -Get fresh air but do  not exercise strenuously -Vitamin D, double up on it - Vitamin C -Zinc         Allergic Rhinitis, Adult Allergic rhinitis is an allergic reaction that affects the mucous membrane inside the nose. It causes sneezing, a runny or stuffy nose, and the feeling of mucus going down the back of the throat (postnasal drip). Allergic rhinitis can be mild to severe. There are two types of allergic rhinitis:  Seasonal. This type is also called hay fever. It happens only during certain seasons.  Perennial. This type can happen at any time of the year. What are the causes? This condition happens when the body's defense system (immune system) responds to certain harmless substances called allergens as though they were germs.  Seasonal allergic rhinitis is triggered by pollen, which can come from grasses, trees, and weeds. Perennial allergic rhinitis may be caused by:  House dust mites.  Pet dander.  Mold spores. What are the signs or symptoms? Symptoms of this condition include:  Sneezing.  Runny or stuffy nose (nasal congestion).  Postnasal drip.  Itchy nose.  Tearing of the eyes.  Trouble sleeping.  Daytime sleepiness. How is this diagnosed? This condition may be diagnosed based on:  Your medical history.  A physical exam.  Tests to check for related conditions, such as: ? Asthma. ? Pink eye. ? Ear infection. ? Upper respiratory infection.  Tests to find out which allergens trigger your symptoms. These may include skin or blood tests. How is this treated? There is no cure for this condition, but treatment can help control symptoms. Treatment may include:  Taking medicines that block allergy symptoms, such as antihistamines. Medicine may be given as a shot, nasal spray, or pill.  Avoiding the allergen.  Desensitization. This treatment involves getting ongoing shots  until your body becomes less sensitive to the allergen. This treatment may be done if other treatments  do not help.  If taking medicine and avoiding the allergen does not work, new, stronger medicines may be prescribed. Follow these instructions at home:  Find out what you are allergic to. Common allergens include smoke, dust, and pollen.  Avoid the things you are allergic to. These are some things you can do to help avoid allergens: ? Replace carpet with wood, tile, or vinyl flooring. Carpet can trap dander and dust. ? Do not smoke. Do not allow smoking in your home. ? Change your heating and air conditioning filter at least once a month. ? During allergy season:  Keep windows closed as much as possible.  Plan outdoor activities when pollen counts are lowest. This is usually during the evening hours.  When coming indoors, change clothing and shower before sitting on furniture or bedding.  Take over-the-counter and prescription medicines only as told by your health care provider.  Keep all follow-up visits as told by your health care provider. This is important. Contact a health care provider if:  You have a fever.  You develop a persistent cough.  You make whistling sounds when you breathe (you wheeze).  Your symptoms interfere with your normal daily activities. Get help right away if:  You have shortness of breath. Summary  This condition can be managed by taking medicines as directed and avoiding allergens.  Contact your health care provider if you develop a persistent cough or fever.  During allergy season, keep windows closed as much as possible. This information is not intended to replace advice given to you by your health care provider. Make sure you discuss any questions you have with your health care provider. Document Released: 01/20/2001 Document Revised: 06/04/2016 Document Reviewed: 06/04/2016 Elsevier Interactive Patient Education  2019 [employer].

## 2018-07-26 ENCOUNTER — Ambulatory Visit: Payer: Self-pay | Admitting: Adult Health

## 2018-08-30 NOTE — Progress Notes (Addendum)
Virtual Visit via Telephone Note  I connected with Mike ConchAndy M Schriner on 09/01/18 at  1:00 PM EDT by telephone and verified that I am speaking with the correct person using two identifiers.   I discussed the limitations, risks, security and privacy concerns of performing an evaluation and management service by telephone and the availability of in person appointments. I also discussed with the patient that there may be a patient responsible charge related to this service. The patient expressed understanding and agreed to proceed.    I discussed the assessment and treatment plan with the patient. The patient was provided an opportunity to ask questions and all were answered. The patient agreed with the plan and demonstrated an understanding of the instructions.   The patient was advised to call back or seek an in-person evaluation if the symptoms worsen or if the condition fails to improve as anticipated.  I provided 22 minutes of non-face-to-face time during this encounter.   Dan MakerAshley C Annalyssa Thune, NP    FOLLOW UP  Assessment and Plan:   Hypertension Fair control off of medications;  Monitor blood pressure at home; patient to call if consistently greater than 130/80 Continue DASH diet.   Reminder to go to the ER if any CP, SOB, nausea, dizziness, severe HA, changes vision/speech, left arm numbness and tingling and jaw pain.  Cholesterol Currently at goal; continue statin at current dose  Continue low cholesterol diet and exercise.  Check lipid panel.    Other abnormal glucose Recent A1C at goal Continue diet and exercise for weight loss Perform daily foot/skin check, notify office of any concerning changes.  Check A1C at CPE  Morbid Obesity with co morbidities Long discussion about weight loss, diet, and exercise Recommended diet heavy in fruits and veggies and low in animal meats, cheeses, and dairy products, appropriate calorie intake Discussed ideal weight for height  Patient will  work on portion sizes; suggested eating off of salad plate, drink water prior to meals, wait for seconds, start weighing weekly and keep a log Will follow up in 6 months  Vitamin D Def Well below goal at last check; he admits to taking irregularly; recommended he increase to 4000 IU and place with other medications continue to recommend supplementation to maintain goal of 60-100 Check Vit D level at CPE  Continue diet and meds as discussed. Further disposition pending results of labs. Discussed med's effects and SE's.   Over 30 minutes of exam, counseling, chart review, and critical decision making was performed.   Future Appointments  Date Time Provider Department Center  12/22/2018 10:00 AM Judd Gaudierorbett, Almee Pelphrey, NP [facility] None   ----------------------------------------------------------------------------------------------------------------------  HPI 48 y.o. male  presents for 6 month follow up on hypertension, cholesterol, glucose management, morbid obesity and vitamin D deficiency.   he has a diagnosis of anxiety and is currently on zoloft 100 mg daily, reports symptoms are well controlled on current regimen.  BMI is Body mass index is 44.45 kg/m., he has been working on diet and exercise, getting more walking with new job, logging 10000+ steps daily. He is trying to eat more chicken/vegetables, cutting down on bread (down to 2-3 times a week), pasta 1 night. He does admits to some soda intake, coke zero x 2 daily, also 4-5 bottles of water daily.  He has a scale but not checking regularly  Wt Readings from Last 3 Encounters:  09/01/18 (!) 301 lb (136.5 kg)  07/06/18 (!) 306 lb (138.8 kg)  12/20/17 (!) 304 lb  6.4 oz (138.1 kg)   His blood pressure has been controlled at home, today their BP is    He does not workout - walks all day at work.  He denies chest pain, shortness of breath, dizziness.   He is on cholesterol medication (atorvastatin 40 mg every other day) and denies  myalgias. His cholesterol is at goal. The cholesterol last visit was:   Lab Results  Component Value Date   CHOL 117 12/20/2017   HDL 35 (L) 12/20/2017   LDLCALC 63 12/20/2017   TRIG 111 12/20/2017   CHOLHDL 3.3 12/20/2017    He has been working on diet and exercise for glucose management (hx of prediabetes), and denies increased appetite, nausea, paresthesia of the feet, polydipsia, polyuria, visual disturbances, vomiting and weight loss. Last A1C in the office was:  Lab Results  Component Value Date   HGBA1C 5.4 12/20/2017   Patient is on Vitamin D supplement.  Taking 2000 IU, but admits he frequently forgets to take.  Lab Results  Component Value Date   VD25OH 36 12/20/2017       Current Medications:  Current Outpatient Medications on File Prior to Visit  Medication Sig  . atorvastatin (LIPITOR) 80 MG tablet TAKE 1/2 TO 1 TABLET DAILY OR AS DIRECTED FOR CHOLESTEROL  . fluticasone (FLONASE) 50 MCG/ACT nasal spray Place 1 spray into both nostrils daily. (Patient taking differently: Place 1 spray into both nostrils as needed. )  . sertraline (ZOLOFT) 100 MG tablet TAKE 1 TABLET BY MOUTH DAILY - PT OVERDUE TO OFFICE VISIT (Patient taking differently: TAKE 1 TABLET BY MOUTH DAILY)   No current facility-administered medications on file prior to visit.      Allergies:  Allergies  Allergen Reactions  . Penicillins Hives    Childhood allergy     Medical History: History reviewed. No pertinent past medical history. Family history- Reviewed and unchanged Social history- Reviewed and unchanged   Review of Systems:  Review of Systems  Constitutional: Negative for malaise/fatigue and weight loss.  HENT: Negative for hearing loss and tinnitus.   Eyes: Negative for blurred vision and double vision.  Respiratory: Negative for cough, shortness of breath and wheezing.   Cardiovascular: Negative for chest pain, palpitations, orthopnea, claudication and leg swelling.   Gastrointestinal: Negative for abdominal pain, blood in stool, constipation, diarrhea, heartburn, melena, nausea and vomiting.  Genitourinary: Negative.   Musculoskeletal: Negative for joint pain and myalgias.  Skin: Negative for rash.  Neurological: Negative for dizziness, tingling, sensory change, weakness and headaches.  Endo/Heme/Allergies: Negative for polydipsia.  Psychiatric/Behavioral: Negative.   All other systems reviewed and are negative.     Physical Exam: Wt (!) 301 lb (136.5 kg)   BMI 44.45 kg/m  Wt Readings from Last 3 Encounters:  09/01/18 (!) 301 lb (136.5 kg)  07/06/18 (!) 306 lb (138.8 kg)  12/20/17 (!) 304 lb 6.4 oz (138.1 kg)   General : Well sounding patient in no apparent distress HEENT: no hoarseness, no cough for duration of visit Lungs: speaks in complete sentences, no audible wheezing, no apparent distress Neurological: alert, oriented x 3 Psychiatric: pleasant, judgement appropriate    Dan Maker, NP 2:11 PM [facility]

## 2018-08-31 ENCOUNTER — Encounter: Payer: Self-pay | Admitting: Adult Health

## 2018-08-31 ENCOUNTER — Ambulatory Visit: Payer: BLUE CROSS/BLUE SHIELD | Admitting: Adult Health

## 2018-08-31 ENCOUNTER — Other Ambulatory Visit: Payer: Self-pay

## 2018-08-31 VITALS — Wt 301.0 lb

## 2018-08-31 DIAGNOSIS — E782 Mixed hyperlipidemia: Secondary | ICD-10-CM

## 2018-08-31 DIAGNOSIS — E559 Vitamin D deficiency, unspecified: Secondary | ICD-10-CM

## 2018-08-31 DIAGNOSIS — R7309 Other abnormal glucose: Secondary | ICD-10-CM

## 2018-08-31 DIAGNOSIS — I10 Essential (primary) hypertension: Secondary | ICD-10-CM

## 2018-08-31 DIAGNOSIS — F419 Anxiety disorder, unspecified: Secondary | ICD-10-CM

## 2018-08-31 DIAGNOSIS — G4733 Obstructive sleep apnea (adult) (pediatric): Secondary | ICD-10-CM

## 2018-08-31 MED ORDER — VITAMIN D 50 MCG (2000 UT) PO TABS: 4000.0000 [IU] | ORAL_TABLET | Freq: Every day | ORAL | Status: DC

## 2018-11-10 ENCOUNTER — Other Ambulatory Visit: Payer: Self-pay | Admitting: Internal Medicine

## 2018-11-10 DIAGNOSIS — E785 Hyperlipidemia, unspecified: Secondary | ICD-10-CM

## 2018-11-14 ENCOUNTER — Encounter: Payer: Self-pay | Admitting: Internal Medicine

## 2018-12-20 ENCOUNTER — Other Ambulatory Visit: Payer: Self-pay | Admitting: Physician Assistant

## 2018-12-20 DIAGNOSIS — F32A Depression, unspecified: Secondary | ICD-10-CM

## 2018-12-20 DIAGNOSIS — F329 Major depressive disorder, single episode, unspecified: Secondary | ICD-10-CM

## 2018-12-22 ENCOUNTER — Encounter: Payer: Self-pay | Admitting: Adult Health

## 2018-12-27 ENCOUNTER — Encounter: Payer: Self-pay | Admitting: Internal Medicine

## 2019-01-03 NOTE — Progress Notes (Signed)
Complete Physical  Assessment and Plan:  Mike Kramer was seen today for annual exam.  Diagnoses and all orders for this visit:  Encounter for routine adult health examination without abnormal findings  Essential hypertension Controlled off of medication Monitor blood pressure at home; call if consistently over 130/80 Continue DASH diet.   Reminder to go to the ER if any CP, SOB, nausea, dizziness, severe HA, changes vision/speech, left arm numbness and tingling and jaw pain. -     COMPLETE METABOLIC PANEL WITH GFR -     TSH -     Urinalysis w microscopic + reflex cultur -     Microalbumin / creatinine urine ratio -     EKG 12-Lead  OSA (obstructive sleep apnea) Wears CPAP, 100%, working well   Vitamin D deficiency Below goal at recent check;  continue to recommend supplementation for goal of 60-100 Check vitamin D level  Other abnormal glucose Discussed disease and risks Discussed diet/exercise, weight management  A1C -     Hemoglobin A1c  Morbid obesity ([place]) Excellent progress with modified keto Long discussion about weight loss, diet, and exercise Recommended diet heavy in fruits and veggies and low in animal meats, cheeses, and dairy products, appropriate calorie intake Discussed appropriate weight for height - current end goal is 200 lb Continue with lifestyle modification, discussed will hit plateau, discussed strategies, can follow up and discuss medications if needed.   Follow up at next visit in 6 months  Mixed hyperlipidemia Continue medications: atorvastatin 40 mg every other day Continue low cholesterol diet and exercise.  Check lipid panel.  -     Lipid panel -     TSH  Anxiety Well managed by current regimen; continue medications Stress management techniques discussed, increase water, good sleep hygiene discussed, increase exercise, and increase veggies.  -     TSH  Medication management -     CBC with Differential/Platelet -     COMPLETE METABOLIC  PANEL WITH GFR -     Magnesium -     Urinalysis w microscopic   Left toe onychomycosis  I explained the natural course of onychomycosis (rarely resolves spontaneously, variable response to therapy).  Discussed down sides to drug therapy, including potential for hepatotoxicity (blood monitoring required), and duration of therapy, sometimes requiring as long as six months for toenail involvement.  Patient wishes to proceed with therapy, and is willing to have baseline liver function tests and serial monitoring of LFTs.    Discussed med's effects and SE's. Screening labs and tests as requested with regular follow-up as recommended.  Future Appointments  Date Time Provider [place]  01/04/2020  2:00 PM Liane Comber, NP [facility] None     HPI Patient presents for a complete physical. He has Labile HTN; Morbid obesity (Mike Kramer); Hyperlipidemia; Vitamin D deficiency; OSA (obstructive sleep apnea); Other abnormal glucose (hx of prediabetes); and Anxiety on their problem list.   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    He is on zoloft 100 mg daily for mood/OCD and feels he is doing well on this dose.   He has OSA on CPAP, endorses 100% compliance with restorative sleep,.   BMI is Body mass index is 38.99 kg/m., he has been working on diet and exercise, pushing water, watching portions, doing a modified keto hard start since June 2020 with his wife. He is aiming to get to 200 lb or so. He is walking a lot at work 10000-12000 steps  daily, 4-5 hours in a paddle boat weekly.  Drinks 6-7 bottles of water daily  Caffeine: 1 caffeinated drink daily  Sleep: good, 6-7 hours  Wt Readings from Last 3 Encounters:  01/04/19 264 lb (119.7 kg)  09/01/18 (!) 301 lb (136.5 kg)  07/06/18 (!) 306 lb (138.8 kg)   His blood pressure has been controlled at home, today their BP is BP: 124/84 He does workout. He denies chest pain, shortness of breath, dizziness.   He is on cholesterol  medication (atorvastatin 40 mg daily every other day) and denies myalgias. His LDL cholesterol is at goal. The cholesterol last visit was:   Lab Results  Component Value Date   CHOL 117 12/20/2017   HDL 35 (L) 12/20/2017   LDLCALC 63 12/20/2017   TRIG 111 12/20/2017   CHOLHDL 3.3 12/20/2017   He reports that he is struggling with healthy diet.  He reports that it is mostly time constraints.  Last A1C in the office was:  Lab Results  Component Value Date   HGBA1C 5.4 12/20/2017   Patient is on Vitamin D supplement, unsure of dose but has increased.  Lab Results  Component Value Date   VD25OH 36 12/20/2017   Last PSA was: Lab Results  Component Value Date   PSA 0.2 12/20/2017  .  Denies BPH symptoms daytime frequency, double voiding, dysuria, hematuria, hesitancy, incontinence, intermittency, nocturia, sensation of incomplete bladder emptying, suprapubic pain, urgency or weak urinary stream.   Lab Results  Component Value Date   VITAMINB12 298 02/20/2016     Current Medications:  Current Outpatient Medications on File Prior to Visit  Medication Sig Dispense Refill  . atorvastatin (LIPITOR) 80 MG tablet TAKE 1/2 TO 1 TABLET DAILY OR AS DIRECTED FOR CHOLESTEROL 90 tablet 0  . Cholecalciferol (VITAMIN D) 50 MCG (2000 UT) tablet Take 2 tablets (4,000 Units total) by mouth daily.    . fluticasone (FLONASE) 50 MCG/ACT nasal spray Place 1 spray into both nostrils daily. (Patient taking differently: Place 1 spray into both nostrils as needed. ) 16 g 2  . sertraline (ZOLOFT) 100 MG tablet TAKE 1 TABLET BY MOUTH DAILY - PATIENT OVERDUE TO OFFICE VISIT 90 tablet 1   No current facility-administered medications on file prior to visit.     Health Maintenance:  Immunization History  Administered Date(s) Administered  . Influenza,inj,quad, With Preservative 02/20/2016  . Tetanus 02/20/2016    Tetanus: 02/20/16 Flu vaccine: 2017  Eye Exam:  [place], ? Dr. , last visit 2020,  wears glasses, goes annually Dentist:  Dr. Elroy ChannelIrvin, Twice yearly visits, 2020, goes [id number]  Patient Care Team: Lucky CowboyMcKeown, [name], MD as PCP - General (Internal Medicine)  Allergies:  Allergies  Allergen Reactions  . Penicillins Hives    Childhood allergy    Medical History: No past medical history on file.  Surgical History:  Past Surgical History:  Procedure Laterality Date  . KNEE SURGERY Right 1991   arthroscopic, "cleaning out"   . ORIF FINGER FRACTURE Right 2017   right pinky  . VASECTOMY  2014   ? Dr. Vernie Ammonsttelin    Family History:  Family History  Problem Relation Age of Onset  . Skin cancer Father 6460       ? melanoma  . Cancer Paternal Grandfather     Social History:   Social History   Tobacco Use  . Smoking status: Never Smoker  . Smokeless tobacco: Never Used  Substance Use Topics  . Alcohol use: Yes  Alcohol/week: 0.0 standard drinks    Comment: occasional  . Drug use: No    Review of Systems:  Review of Systems  Constitutional: Negative for malaise/fatigue and weight loss.  HENT: Negative for hearing loss and tinnitus.   Eyes: Negative for blurred vision and double vision.  Respiratory: Negative for cough, sputum production, shortness of breath and wheezing.   Cardiovascular: Negative for chest pain, palpitations, orthopnea, claudication, leg swelling and PND.  Gastrointestinal: Negative for abdominal pain, blood in stool, constipation, diarrhea, heartburn, melena, nausea and vomiting.  Genitourinary: Negative.   Musculoskeletal: Negative for joint pain and myalgias.  Skin: Negative for rash.  Neurological: Negative for dizziness, tingling, sensory change, weakness and headaches.  Endo/Heme/Allergies: Negative for polydipsia.  Psychiatric/Behavioral: Negative.  Negative for depression, memory loss, substance abuse and suicidal ideas. The patient is not nervous/anxious and does not have insomnia.   All other systems reviewed and are  negative.   Physical Exam: Estimated body mass index is 38.99 kg/m as calculated from the following:   Height as of this encounter: 5\' 9"  (1.753 m).   Weight as of this encounter: 264 lb (119.7 kg). BP 124/84   Pulse 61   Temp 97.7 F (36.5 C)   Ht 5\' 9"  (1.753 m)   Wt 264 lb (119.7 kg)   SpO2 96%   BMI 38.99 kg/m   General Appearance: Well nourished, in no apparent distress.  Eyes: PERRLA, EOMs, conjunctiva no swelling or erythema ENT/Mouth: Ear canals clear bilaterally with no erythema, swelling, discharge.  TMs normal bilaterally with no erythema, bulging, or retractions.  Oropharynx clear and moist with no exudate, swelling, or erythema.  Dentition normal.   Neck: Supple, thyroid normal. No bruits, JVD, cervical adenopathy Respiratory: Respiratory effort normal, BS equal bilaterally without rales, rhonchi, wheezing or stridor.  Cardio: RRR without murmurs, rubs or gallops. Brisk peripheral pulses without edema.  Chest: symmetric, with normal excursions Abdomen: Soft, nontender, no guarding, rebound, hernias, masses, or organomegaly. Genitourinary: Defer, no concerns Musculoskeletal: Full ROM all peripheral extremities,5/5 strength, and normal gait.  Skin: Warm, dry without rashes, lesions, ecchymosis. Left great toe thickened and yellow medially, not extending to base Neuro: A&Ox3, Cranial nerves intact, reflexes equal bilaterally. Normal muscle tone, no cerebellar symptoms. Sensation intact.  Psych: Normal affect, Insight and Judgment appropriate.   EKG: Sinus brady, NSCPT  AORTA SCAN: defer  Over 40 minutes of exam, counseling, chart review and critical decision making was performed  Dan Maker 2:00 PM [facility]

## 2019-01-04 ENCOUNTER — Encounter: Payer: Self-pay | Admitting: Adult Health

## 2019-01-04 ENCOUNTER — Ambulatory Visit (INDEPENDENT_AMBULATORY_CARE_PROVIDER_SITE_OTHER): Payer: BC Managed Care – PPO | Admitting: Adult Health

## 2019-01-04 ENCOUNTER — Other Ambulatory Visit: Payer: Self-pay

## 2019-01-04 VITALS — BP 124/84 | HR 61 | Temp 97.7°F | Ht 69.0 in | Wt 264.0 lb

## 2019-01-04 DIAGNOSIS — R7309 Other abnormal glucose: Secondary | ICD-10-CM

## 2019-01-04 DIAGNOSIS — E782 Mixed hyperlipidemia: Secondary | ICD-10-CM

## 2019-01-04 DIAGNOSIS — Z131 Encounter for screening for diabetes mellitus: Secondary | ICD-10-CM | POA: Diagnosis not present

## 2019-01-04 DIAGNOSIS — E559 Vitamin D deficiency, unspecified: Secondary | ICD-10-CM | POA: Diagnosis not present

## 2019-01-04 DIAGNOSIS — B351 Tinea unguium: Secondary | ICD-10-CM | POA: Insufficient documentation

## 2019-01-04 DIAGNOSIS — Z1329 Encounter for screening for other suspected endocrine disorder: Secondary | ICD-10-CM

## 2019-01-04 DIAGNOSIS — I10 Essential (primary) hypertension: Secondary | ICD-10-CM | POA: Diagnosis not present

## 2019-01-04 DIAGNOSIS — Z0000 Encounter for general adult medical examination without abnormal findings: Secondary | ICD-10-CM | POA: Diagnosis not present

## 2019-01-04 DIAGNOSIS — Z13220 Encounter for screening for lipoid disorders: Secondary | ICD-10-CM | POA: Diagnosis not present

## 2019-01-04 DIAGNOSIS — Z130 Encounter for screening for diseases of the blood and blood-forming organs and certain disorders involving the immune mechanism: Secondary | ICD-10-CM

## 2019-01-04 DIAGNOSIS — G4733 Obstructive sleep apnea (adult) (pediatric): Secondary | ICD-10-CM

## 2019-01-04 DIAGNOSIS — D649 Anemia, unspecified: Secondary | ICD-10-CM

## 2019-01-04 DIAGNOSIS — F419 Anxiety disorder, unspecified: Secondary | ICD-10-CM

## 2019-01-04 DIAGNOSIS — Z136 Encounter for screening for cardiovascular disorders: Secondary | ICD-10-CM | POA: Diagnosis not present

## 2019-01-04 DIAGNOSIS — Z1389 Encounter for screening for other disorder: Secondary | ICD-10-CM

## 2019-01-04 DIAGNOSIS — E538 Deficiency of other specified B group vitamins: Secondary | ICD-10-CM

## 2019-01-04 MED ORDER — TERBINAFINE HCL 250 MG PO TABS: 250.0000 mg | ORAL_TABLET | Freq: Every day | ORAL | 0 refills | Status: DC

## 2019-01-04 NOTE — Patient Instructions (Addendum)
Mike Kramer , Thank you for taking time to come for your Medicare Wellness Visit. I appreciate your ongoing commitment to your health goals. Please review the following plan we discussed and let me know if I can assist you in the future.   These are the goals we discussed: Goals    . Weight (lb) < 200 lb (90.7 kg) (pt-stated)       This is a list of the screening recommended for you and due dates:  Health Maintenance  Topic Date Due  . HIV Screening  01/12/1988  . Flu Shot  12/10/2018  . Tetanus Vaccine  02/19/2026    Know what a healthy weight is for you (roughly BMI <25) and aim to maintain this  Aim for 7+ servings of fruits and vegetables daily  65-80+ fluid ounces of water or unsweet tea for healthy kidneys  Limit to max 1 drink of alcohol per day; avoid smoking/tobacco  Limit animal fats in diet for cholesterol and heart health - choose grass fed whenever available  Avoid highly processed foods, and foods high in saturated/trans fats  Aim for low stress - take time to unwind and care for your mental health  Aim for 150 min of moderate intensity exercise weekly for heart health, and weights twice weekly for bone health  Aim for 7-9 hours of sleep daily     Fungal Nail Infection A fungal nail infection is a common infection of the toenails or fingernails. This condition affects toenails more often than fingernails. It often affects the great, or big, toes. More than one nail may be infected. The condition can be passed from person to person (is contagious). What are the causes? This condition is caused by a fungus. Several types of fungi can cause the infection. These fungi are common in moist and warm areas. If your hands or feet come into contact with the fungus, it may get into a crack in your fingernail or toenail and cause the infection. What increases the risk? The following factors may make you more likely to develop this condition:  Being male.  Being of  older age.  Living with someone who has the fungus.  Walking barefoot in areas where the fungus thrives, such as showers or locker rooms.  Wearing shoes and socks that cause your feet to sweat.  Having a nail injury or a recent nail surgery.  Having certain medical conditions, such as: ? Athlete's foot. ? Diabetes. ? Psoriasis. ? Poor circulation. ? A weak body defense system (immune system). What are the signs or symptoms? Symptoms of this condition include:  A pale spot on the nail.  Thickening of the nail.  A nail that becomes yellow or brown.  A brittle or ragged nail edge.  A crumbling nail.  A nail that has lifted away from the nail bed. How is this diagnosed? This condition is diagnosed with a physical exam. Your health care provider may take a scraping or clipping from your nail to test for the fungus. How is this treated? Treatment is not needed for mild infections. If you have significant nail changes, treatment may include:  Antifungal medicines taken by mouth (orally). You may need to take the medicine for several weeks or several months, and you may not see the results for a long time. These medicines can cause side effects. Ask your health care provider what problems to watch for.  Antifungal nail polish or nail cream. These may be used along with oral antifungal  medicines.  Laser treatment of the nail.  Surgery to remove the nail. This may be needed for the most severe infections. It can take a long time, usually up to a year, for the infection to go away. The infection may also come back. Follow these instructions at home: Medicines  Take or apply over-the-counter and prescription medicines only as told by your health care provider.  Ask your health care provider about using over-the-counter mentholated ointment on your nails. Nail care  Trim your nails often.  Wash and dry your hands and feet every day.  Keep your feet dry: ? Wear absorbent  socks, and change your socks frequently. ? Wear shoes that allow air to circulate, such as sandals or canvas tennis shoes. Throw out old shoes.  Do not use artificial nails.  If you go to a nail salon, make sure you choose one that uses clean instruments.  Use antifungal foot powder on your feet and in your shoes. General instructions  Do not share personal items, such as towels or nail clippers.  Do not walk barefoot in shower rooms or locker rooms.  Wear rubber gloves if you are working with your hands in wet areas.  Keep all follow-up visits as told by your health care provider. This is important. Contact a health care provider if: Your infection is not getting better or it is getting worse after several months. Summary  A fungal nail infection is a common infection of the toenails or fingernails.  Treatment is not needed for mild infections. If you have significant nail changes, treatment may include taking medicine orally and applying medicine to your nails.  It can take a long time, usually up to a year, for the infection to go away. The infection may also come back.  Take or apply over-the-counter and prescription medicines only as told by your health care provider.  Follow instructions for taking care of your nails to help prevent infection from coming back or spreading. This information is not intended to replace advice given to you by your health care provider. Make sure you discuss any questions you have with your health care provider. Document Released: 04/24/2000 Document Revised: 08/18/2018 Document Reviewed: 10/01/2017 Elsevier Patient Education  2020 Else[employer]    Terbinafine tablets What is this medicine? TERBINAFINE (TER bin a feen) is an antifungal medicine. It is used to treat certain kinds of fungal or yeast infections. This medicine may be used for other purposes; ask your health care provider or pharmacist if you have questions. COMMON BRAND NAME(S):  Lamisil, Terbinex What should I tell my health care provider before I take this medicine? They need to know if you have any of these conditions:  drink alcoholic beverages  kidney disease  liver disease  an unusual or allergic reaction to terbinafine, other medicines, foods, dyes, or preservatives  pregnant or trying to get pregnant  breast-feeding How should I use this medicine? Take this medicine by mouth with a full glass of water. Follow the directions on the prescription label. You can take this medicine with food or on an empty stomach. Take your medicine at regular intervals. Do not take your medicine more often than directed. Do not skip doses or stop your medicine early even if you feel better. Do not stop taking except on your doctor's advice. Talk to your pediatrician regarding the use of this medicine in children. Special care may be needed. Overdosage: If you think you have taken too much of this medicine  contact a poison control center or emergency room at once. NOTE: This medicine is only for you. Do not share this medicine with others. What if I miss a dose? If you miss a dose, take it as soon as you can. If it is almost time for your next dose, take only that dose. Do not take double or extra doses. What may interact with this medicine? Do not take this medicine with any of the following medications:  thioridazine This medicine may also interact with the following medications:  beta-blockers  caffeine  cimetidine  cyclosporine  medicines for depression, anxiety, or psychotic disturbances  medicines for fungal infections like fluconazole and ketoconazole  medicines for irregular heartbeat like amiodarone, flecainide and propafenone  rifampin  warfarin This list may not describe all possible interactions. Give your health care provider a list of all the medicines, herbs, non-prescription drugs, or dietary supplements you use. Also tell them if you smoke,  drink alcohol, or use illegal drugs. Some items may interact with your medicine. What should I watch for while using this medicine? Visit your doctor or health care provider regularly. Tell your doctor right away if you have nausea or vomiting, loss of appetite, stomach pain on your right upper side, yellow skin, dark urine, light stools, or are over tired. Some fungal infections need many weeks or months of treatment to cure. If you are taking this medicine for a long time, you will need to have important blood work done. This medicine may cause serious skin reactions. They can happen weeks to months after starting the medicine. Contact your health care provider right away if you notice fevers or flu-like symptoms with a rash. The rash may be red or purple and then turn into blisters or peeling of the skin. Or, you might notice a red rash with swelling of the face, lips or lymph nodes in your neck or under your arms. What side effects may I notice from receiving this medicine? Side effects that you should report to your doctor or health care professional as soon as possible:  allergic reactions like skin rash or hives, swelling of the face, lips, or tongue  changes in vision  dark urine  fever or infection  general ill feeling or flu-like symptoms  light-colored stools  loss of appetite, nausea  rash, fever, and swollen lymph nodes  redness, blistering, peeling or loosening of the skin, including inside the mouth  right upper belly pain  unusually weak or tired  yellowing of the eyes or skin Side effects that usually do not require medical attention (report to your doctor or health care professional if they continue or are bothersome):  changes in taste  diarrhea  hair loss  muscle or joint pain  stomach gas  stomach upset This list may not describe all possible side effects. Call your doctor for medical advice about side effects. You may report side effects to FDA at  1-800-FDA-1088. Where should I keep my medicine? Keep out of the reach of children. Store at room temperature below 25 degrees C (77 degrees F). Protect from light. Throw away any unused medicine after the expiration date. NOTE: This sheet is a summary. It may not cover all possible information. If you have questions about this medicine, talk to your doctor, pharmacist, or health care provider.  2020 Elsevier/Gold Standard (2018-08-05 15:37:07)

## 2019-01-05 LAB — CBC WITH DIFFERENTIAL/PLATELET
Absolute Monocytes: 525 cells/uL (ref 200–950)
Basophils Absolute: 49 cells/uL (ref 0–200)
Basophils Relative: 0.6 %
Eosinophils Absolute: 213 cells/uL (ref 15–500)
Eosinophils Relative: 2.6 %
HCT: 44.3 % (ref 38.5–50.0)
Hemoglobin: 14.8 g/dL (ref 13.2–17.1)
Lymphs Abs: 2148 cells/uL (ref 850–3900)
MCH: 30.6 pg (ref 27.0–33.0)
MCHC: 33.4 g/dL (ref 32.0–36.0)
MCV: 91.5 fL (ref 80.0–100.0)
MPV: 10.8 fL (ref 7.5–12.5)
Monocytes Relative: 6.4 %
Neutro Abs: 5264 cells/uL (ref 1500–7800)
Neutrophils Relative %: 64.2 %
Platelets: 236 10*3/uL (ref 140–400)
RBC: 4.84 10*6/uL (ref 4.20–5.80)
RDW: 14.2 % (ref 11.0–15.0)
Total Lymphocyte: 26.2 %
WBC: 8.2 10*3/uL (ref 3.8–10.8)

## 2019-01-05 LAB — URINALYSIS, ROUTINE W REFLEX MICROSCOPIC
Bilirubin Urine: NEGATIVE
Glucose, UA: NEGATIVE
Hgb urine dipstick: NEGATIVE
Leukocytes,Ua: NEGATIVE
Nitrite: NEGATIVE
Protein, ur: NEGATIVE
Specific Gravity, Urine: 1.025 (ref 1.001–1.03)
pH: 5.5 (ref 5.0–8.0)

## 2019-01-05 LAB — COMPLETE METABOLIC PANEL WITH GFR
AG Ratio: 2 (calc) (ref 1.0–2.5)
ALT: 39 U/L (ref 9–46)
AST: 32 U/L (ref 10–40)
Albumin: 4.3 g/dL (ref 3.6–5.1)
Alkaline phosphatase (APISO): 66 U/L (ref 36–130)
BUN: 20 mg/dL (ref 7–25)
CO2: 25 mmol/L (ref 20–32)
Calcium: 9.6 mg/dL (ref 8.6–10.3)
Chloride: 106 mmol/L (ref 98–110)
Creat: 0.92 mg/dL (ref 0.60–1.35)
GFR, Est African American: 116 mL/min/{1.73_m2} (ref >=60)
GFR, Est Non African American: 100 mL/min/{1.73_m2} (ref >=60)
Globulin: 2.2 g/dL (calc) (ref 1.9–3.7)
Glucose, Bld: 87 mg/dL (ref 65–99)
Potassium: 4.2 mmol/L (ref 3.5–5.3)
Sodium: 141 mmol/L (ref 135–146)
Total Bilirubin: 0.6 mg/dL (ref 0.2–1.2)
Total Protein: 6.5 g/dL (ref 6.1–8.1)

## 2019-01-05 LAB — VITAMIN D 25 HYDROXY (VIT D DEFICIENCY, FRACTURES): Vit D, 25-Hydroxy: 34 ng/mL (ref 30–100)

## 2019-01-05 LAB — HEMOGLOBIN A1C
Hgb A1c MFr Bld: 5.1 % of total Hgb (ref <5.7)
Mean Plasma Glucose: 100 (calc)
eAG (mmol/L): 5.5 (calc)

## 2019-01-05 LAB — LIPID PANEL
Cholesterol: 136 mg/dL (ref <200)
HDL: 33 mg/dL — ABNORMAL LOW (ref >=40)
LDL Cholesterol (Calc): 70 mg/dL (calc)
Non-HDL Cholesterol (Calc): 103 mg/dL (calc) (ref <130)
Total CHOL/HDL Ratio: 4.1 (calc) (ref <5.0)
Triglycerides: 241 mg/dL — ABNORMAL HIGH (ref <150)

## 2019-01-05 LAB — TSH: TSH: 1.33 mIU/L (ref 0.40–4.50)

## 2019-01-05 LAB — VITAMIN B12: Vitamin B-12: 455 pg/mL (ref 200–1100)

## 2019-02-15 ENCOUNTER — Other Ambulatory Visit: Payer: Self-pay

## 2019-02-15 ENCOUNTER — Other Ambulatory Visit: Payer: BC Managed Care – PPO

## 2019-02-15 DIAGNOSIS — B351 Tinea unguium: Secondary | ICD-10-CM | POA: Diagnosis not present

## 2019-02-16 LAB — HEPATIC FUNCTION PANEL
AG Ratio: 1.8 (calc) (ref 1.0–2.5)
ALT: 40 U/L (ref 9–46)
AST: 32 U/L (ref 10–40)
Albumin: 4.2 g/dL (ref 3.6–5.1)
Alkaline phosphatase (APISO): 70 U/L (ref 36–130)
Bilirubin, Direct: 0.1 mg/dL (ref 0.0–0.2)
Globulin: 2.3 g/dL (calc) (ref 1.9–3.7)
Indirect Bilirubin: 0.5 mg/dL (calc) (ref 0.2–1.2)
Total Bilirubin: 0.6 mg/dL (ref 0.2–1.2)
Total Protein: 6.5 g/dL (ref 6.1–8.1)

## 2019-02-25 ENCOUNTER — Other Ambulatory Visit: Payer: Self-pay | Admitting: Adult Health

## 2019-02-25 DIAGNOSIS — E785 Hyperlipidemia, unspecified: Secondary | ICD-10-CM

## 2019-06-19 ENCOUNTER — Other Ambulatory Visit: Payer: Self-pay | Admitting: Physician Assistant

## 2019-06-19 DIAGNOSIS — F32A Depression, unspecified: Secondary | ICD-10-CM

## 2019-06-19 DIAGNOSIS — F329 Major depressive disorder, single episode, unspecified: Secondary | ICD-10-CM

## 2019-07-11 ENCOUNTER — Ambulatory Visit: Payer: BC Managed Care – PPO | Admitting: Adult Health

## 2019-07-13 ENCOUNTER — Other Ambulatory Visit: Payer: Self-pay | Admitting: Internal Medicine

## 2019-07-13 DIAGNOSIS — F329 Major depressive disorder, single episode, unspecified: Secondary | ICD-10-CM

## 2019-07-13 DIAGNOSIS — F32A Depression, unspecified: Secondary | ICD-10-CM

## 2019-07-14 ENCOUNTER — Ambulatory Visit: Payer: BC Managed Care – PPO | Admitting: Adult Health

## 2019-07-17 ENCOUNTER — Ambulatory Visit: Payer: BC Managed Care – PPO | Admitting: Adult Health

## 2019-07-31 NOTE — Progress Notes (Signed)
FOLLOW UP  Assessment and Plan:   Cholesterol Currently at goal; continue statin  Continue low cholesterol diet and exercise.  Check lipid panel.   Hx of prediabetes Recent A1Cs at goal Discussed diet/exercise, weight management  Defer A1C; check CMP  Obesity - BMI 30 with co morbidities - chol, OSA Commended excellent weight loss progress, down 100 lb in last year with keto Risks of keto discussed, reviewed healthier modifications Long discussion about weight loss, diet, and exercise Recommended diet heavy in fruits and veggies and low in animal meats, cheeses, and dairy products, appropriate calorie intake Discussed ideal weight for height (below <170 lb) and initial weight goal (<200 lb) Will follow up in 3 months  Vitamin D Def Below goal at last visit; increase to 7000 IU continue supplementation to maintain goal of 60-100 Defer Vit D level  Depression/anxiety Continue medications  Lifestyle discussed: diet/exerise, sleep hygiene, stress management, hydration  L toe onychomychosis ? Improvement with first cycle of lamisil Monitor as toe nail grows for another 3-6 months Repeat with 3 cycles of 1 month/on and off if needed.   Continue diet and meds as discussed. Further disposition pending results of labs. Discussed med's effects and SE's.   Over 30 minutes of exam, counseling, chart review, and critical decision making was performed.   Future Appointments  Date Time Provider Department Center  01/04/2020  2:00 PM [name], NP [id number] None    ----------------------------------------------------------------------------------------------------------------------  HPI 49 y.o. male  presents for 6 month follow up on cholesterol,  Weight, mood, and vitamin D deficiency.   He had L great toe nail fungus x 3 months last fall, repeat at next visit if needed.   He is on zoloft 100 mg daily for mood/OCD and feels he is doing well on this dose.   He has OSA on  CPAP, endorses 100% compliance with restorative sleep.   BMI is Body mass index is 30.63 kg/m., he has been working on diet and exercise, pushing water, watching portions, doing a modified keto hard start since June 2020 with his wife.  No carb, no sugars, veggies and lean protein He is aiming to get to 200 lb or so.  He is walking a lot at work 10000-12000 steps daily, 4-5 hours in a paddle boat weekly when warm Drinks 6-7 bottles of water daily  Caffeine: 1 caffeinated drink daily  Sleep: good, 6-7 hours  Wt Readings from Last 3 Encounters:  08/01/19 207 lb 6.4 oz (94.1 kg)  01/04/19 264 lb (119.7 kg)  09/01/18 (!) 301 lb (136.5 kg)   Today their BP is BP: 122/80  He does workout. He denies chest pain, shortness of breath, dizziness.   He is on cholesterol medication Atorvastatin 80 mg every other day and denies myalgias. His cholesterol is at goal. The cholesterol last visit was:   Lab Results  Component Value Date   CHOL 136 01/04/2019   HDL 33 (L) 01/04/2019   LDLCALC 70 01/04/2019   TRIG 241 (H) 01/04/2019   CHOLHDL 4.1 01/04/2019    He has been working on diet and exercise for glucose management (hx of prediabetes, a1c 5.7% IN 2017), and denies nausea, paresthesia of the feet, polydipsia, polyuria, visual disturbances and vomiting. Last A1C in the office was:  Lab Results  Component Value Date   HGBA1C 5.1 01/04/2019    Last GFR:  Lab Results  Component Value Date   GFRNONAA 100 01/04/2019   Patient is on Vitamin D supplement, increased  from 4000 IU to 7000 IU, taking daily   Lab Results  Component Value Date   VD25OH 34 01/04/2019        Current Medications:  Current Outpatient Medications on File Prior to Visit  Medication Sig  . atorvastatin (LIPITOR) 80 MG tablet Take 1 tablet Daily for Cholesterol  . Cholecalciferol (VITAMIN D) 50 MCG (2000 UT) tablet Take 2 tablets (4,000 Units total) by mouth daily.  . fluticasone (FLONASE) 50 MCG/ACT nasal spray Place  1 spray into both nostrils daily. (Patient taking differently: Place 1 spray into both nostrils as needed. )  . sertraline (ZOLOFT) 100 MG tablet TAKE 1 TABLET BY MOUTH EVERY DAY FOR MOOD   No current facility-administered medications on file prior to visit.     Allergies:  Allergies  Allergen Reactions  . Penicillins Hives    Childhood allergy     Medical History: No past medical history on file. Family history- Reviewed and unchanged Social history- Reviewed and unchanged   Review of Systems:  Review of Systems  Constitutional: Negative for malaise/fatigue and weight loss.  HENT: Negative for hearing loss and tinnitus.   Eyes: Negative for blurred vision and double vision.  Respiratory: Negative for cough, shortness of breath and wheezing.   Cardiovascular: Negative for chest pain, palpitations, orthopnea, claudication and leg swelling.  Gastrointestinal: Negative for abdominal pain, blood in stool, constipation, diarrhea, heartburn, melena, nausea and vomiting.  Genitourinary: Negative.   Musculoskeletal: Negative for joint pain and myalgias.  Skin: Negative for rash.  Neurological: Negative for dizziness, tingling, sensory change, weakness and headaches.  Endo/Heme/Allergies: Negative for polydipsia.  Psychiatric/Behavioral: Negative.   All other systems reviewed and are negative.     Physical Exam: BP 122/80   Pulse 65   Temp (!) 97 F (36.1 C)   Ht 5\' 9"  (1.753 m)   Wt 207 lb 6.4 oz (94.1 kg)   SpO2 97%   BMI 30.63 kg/m  Wt Readings from Last 3 Encounters:  08/01/19 207 lb 6.4 oz (94.1 kg)  01/04/19 264 lb (119.7 kg)  09/01/18 (!) 301 lb (136.5 kg)   General Appearance: Well nourished, in no apparent distress. Eyes: PERRLA, EOMs, conjunctiva no swelling or erythema Sinuses: No Frontal/maxillary tenderness ENT/Mouth: Ext aud canals clear, TMs without erythema, bulging. No erythema, swelling, or exudate on post pharynx.  Tonsils not swollen or erythematous.  Hearing normal.  Neck: Supple, thyroid normal.  Respiratory: Respiratory effort normal, BS equal bilaterally without rales, rhonchi, wheezing or stridor.  Cardio: RRR with no MRGs. Brisk peripheral pulses without edema.  Abdomen: Soft, + BS.  Non tender, no guarding, rebound, hernias, masses. Lymphatics: Non tender without lymphadenopathy.  Musculoskeletal: Full ROM, 5/5 strength, Normal gait. L 1st toe nail with strip of yellowed area extending nearly to the base.  Skin: Warm, dry without rashes, lesions, ecchymosis.  Neuro: Cranial nerves intact. No cerebellar symptoms.  Psych: Awake and oriented X 3, normal affect, Insight and Judgment appropriate.    Izora Ribas, NP 4:06 PM [facility]

## 2019-08-01 ENCOUNTER — Ambulatory Visit: Payer: BC Managed Care – PPO | Admitting: Adult Health

## 2019-08-01 ENCOUNTER — Other Ambulatory Visit: Payer: Self-pay

## 2019-08-01 ENCOUNTER — Encounter: Payer: Self-pay | Admitting: Adult Health

## 2019-08-01 VITALS — BP 122/80 | HR 65 | Temp 97.0°F | Ht 69.0 in | Wt 207.4 lb

## 2019-08-01 DIAGNOSIS — G4733 Obstructive sleep apnea (adult) (pediatric): Secondary | ICD-10-CM | POA: Diagnosis not present

## 2019-08-01 DIAGNOSIS — E559 Vitamin D deficiency, unspecified: Secondary | ICD-10-CM | POA: Diagnosis not present

## 2019-08-01 DIAGNOSIS — R7309 Other abnormal glucose: Secondary | ICD-10-CM | POA: Diagnosis not present

## 2019-08-01 DIAGNOSIS — E782 Mixed hyperlipidemia: Secondary | ICD-10-CM | POA: Diagnosis not present

## 2019-08-01 DIAGNOSIS — E669 Obesity, unspecified: Secondary | ICD-10-CM

## 2019-08-01 DIAGNOSIS — I10 Essential (primary) hypertension: Secondary | ICD-10-CM | POA: Diagnosis not present

## 2019-08-01 DIAGNOSIS — F419 Anxiety disorder, unspecified: Secondary | ICD-10-CM

## 2019-08-01 DIAGNOSIS — E66811 Obesity, class 1: Secondary | ICD-10-CM

## 2019-08-01 LAB — COMPLETE METABOLIC PANEL WITH GFR
AG Ratio: 2 (calc) (ref 1.0–2.5)
ALT: 42 U/L (ref 9–46)
AST: 27 U/L (ref 10–40)
Albumin: 4.3 g/dL (ref 3.6–5.1)
Alkaline phosphatase (APISO): 63 U/L (ref 36–130)
BUN: 24 mg/dL (ref 7–25)
CO2: 26 mmol/L (ref 20–32)
Calcium: 9.9 mg/dL (ref 8.6–10.3)
Chloride: 104 mmol/L (ref 98–110)
Creat: 0.91 mg/dL (ref 0.60–1.35)
GFR, Est African American: 117 mL/min/{1.73_m2} (ref >=60)
GFR, Est Non African American: 101 mL/min/{1.73_m2} (ref >=60)
Globulin: 2.2 g/dL (calc) (ref 1.9–3.7)
Glucose, Bld: 93 mg/dL (ref 65–99)
Potassium: 4.1 mmol/L (ref 3.5–5.3)
Sodium: 139 mmol/L (ref 135–146)
Total Bilirubin: 0.5 mg/dL (ref 0.2–1.2)
Total Protein: 6.5 g/dL (ref 6.1–8.1)

## 2019-08-01 LAB — LIPID PANEL
Cholesterol: 170 mg/dL (ref <200)
HDL: 44 mg/dL (ref >=40)
LDL Cholesterol (Calc): 108 mg/dL (calc) — ABNORMAL HIGH
Non-HDL Cholesterol (Calc): 126 mg/dL (calc) (ref <130)
Total CHOL/HDL Ratio: 3.9 (calc) (ref <5.0)
Triglycerides: 85 mg/dL (ref <150)

## 2019-08-01 LAB — CBC WITH DIFFERENTIAL/PLATELET
Absolute Monocytes: 580 cells/uL (ref 200–950)
Basophils Absolute: 41 cells/uL (ref 0–200)
Basophils Relative: 0.6 %
Eosinophils Absolute: 186 cells/uL (ref 15–500)
Eosinophils Relative: 2.7 %
HCT: 43.7 % (ref 38.5–50.0)
Hemoglobin: 14.3 g/dL (ref 13.2–17.1)
Lymphs Abs: 2367 cells/uL (ref 850–3900)
MCH: 30 pg (ref 27.0–33.0)
MCHC: 32.7 g/dL (ref 32.0–36.0)
MCV: 91.8 fL (ref 80.0–100.0)
MPV: 9.8 fL (ref 7.5–12.5)
Monocytes Relative: 8.4 %
Neutro Abs: 3726 cells/uL (ref 1500–7800)
Neutrophils Relative %: 54 %
Platelets: 214 10*3/uL (ref 140–400)
RBC: 4.76 10*6/uL (ref 4.20–5.80)
RDW: 13.1 % (ref 11.0–15.0)
Total Lymphocyte: 34.3 %
WBC: 6.9 10*3/uL (ref 3.8–10.8)

## 2019-08-01 LAB — VITAMIN D 25 HYDROXY (VIT D DEFICIENCY, FRACTURES): Vit D, 25-Hydroxy: 46 ng/mL (ref 30–100)

## 2019-08-01 LAB — TSH: TSH: 1.34 mIU/L (ref 0.40–4.50)

## 2019-08-01 NOTE — Patient Instructions (Addendum)
Goals    . Weight (lb) < 200 lb (90.7 kg) (pt-stated)       FYI - weight goal of <170 lb would put you at a normal BMI range (low risk for medical problems according to research)   As you approach your weight goal, recommend focusing on reducing dairy/red meat fats as these are higher in saturated/trans fats, associated with increase risk of heart attacks, strokes. If can get LDL number down, we will try to get off of cholesterol medication.   Healthier sources of fat or olives/olive oil, avocado, nuts, etc   Guidelines recommend limiting red meat as much as possible, aim for <6 oz/week Red meat is associated with increased risk of many cancers  Otherwise keep up the excellent work! I am very excited to see your progress -    High Triglycerides Eating Plan Triglycerides are a type of fat in the blood. High levels of triglycerides can increase your risk of heart disease and stroke. If your triglyceride levels are high, choosing the right foods can help lower your triglycerides and keep your heart healthy. Work with your health care provider or a diet and nutrition specialist (dietitian) to develop an eating plan that is right for you. What are tips for following this plan? General guidelines   Lose weight, if you are overweight. For most people, losing 5-10 lbs (2-5 kg) helps lower triglyceride levels. A weight-loss plan may include. ? 30 minutes of exercise at least 5 days a week. ? Reducing the amount of calories, sugar, and fat you eat.  Eat a wide variety of fresh fruits, vegetables, and whole grains. These foods are high in fiber.  Eat foods that contain healthy fats, such as fatty fish, nuts, seeds, and olive oil.  Avoid foods that are high in added sugar, added salt (sodium), saturated fat, and trans fat.  Avoid low-fiber, refined carbohydrates such as white bread, crackers, noodles, and white rice.  Avoid foods with partially hydrogenated oils (trans fats), such as fried  foods or stick margarine.  Limit alcohol intake to no more than 1 drink a day for nonpregnant women and 2 drinks a day for men. One drink equals 12 oz of beer, 5 oz of wine, or 1 oz of hard liquor. Your health care provider may recommend that you drink less depending on your overall health. Reading food labels  Check food labels for the amount of saturated fat. Choose foods with no or very little saturated fat.  Check food labels for the amount of trans fat. Choose foods with no trans fat.  Check food labels for the amount of cholesterol. Choose foods low in cholesterol. Ask your dietitian how much cholesterol you should have each day.  Check food labels for the amount of sodium. Choose foods with less than 140 milligrams (mg) per serving. Shopping  Buy dairy products labeled as nonfat (skim) or low-fat (1%).  Avoid buying processed or prepackaged foods. These are often high in added sugar, sodium, and fat. Cooking  Choose healthy fats when cooking, such as olive oil or canola oil.  Cook foods using lower fat methods, such as baking, broiling, boiling, or grilling.  Make your own sauces, dressings, and marinades when possible, instead of buying them. Store-bought sauces, dressings, and marinades are often high in sodium and sugar. Meal planning  Eat more home-cooked food and less restaurant, buffet, and fast food.  Eat fatty fish at least 2 times each week. Examples of fatty fish include salmon, trout,  mackerel, tuna, and herring.  If you eat whole eggs, do not eat more than 3 egg yolks per week. What foods are recommended? The items listed may not be a complete list. Talk with your dietitian about what dietary choices are best for you. Grains Whole wheat or whole grain breads, crackers, cereals, and pasta. Unsweetened oatmeal. Bulgur. Barley. Quinoa. Brown rice. Whole wheat flour tortillas. Vegetables Fresh or frozen vegetables. Low-sodium canned vegetables. Fruits All fresh,  canned (in natural juice), or frozen fruits. Meats and other protein foods Skinless chicken or [place]. Ground chicken or [place]. Lean cuts of pork, trimmed of fat. Fish and seafood, especially salmon, trout, and herring. Egg whites. Dried beans, peas, or lentils. Unsalted nuts or seeds. Unsalted canned beans. Natural peanut or almond butter. Dairy Low-fat dairy products. Skim or low-fat (1%) milk. Reduced fat (2%) and low-sodium cheese. Low-fat ricotta cheese. Low-fat cottage cheese. Plain, low-fat yogurt. Fats and oils Tub margarine without trans fats. Light or reduced-fat mayonnaise. Light or reduced-fat salad dressings. Avocado. Safflower, olive, sunflower, soybean, and canola oils. What foods are not recommended? The items listed may not be a complete list. Talk with your dietitian about what dietary choices are best for you. Grains White bread. White (regular) pasta. White rice. Cornbread. Bagels. Pastries. Crackers that contain trans fat. Vegetables Creamed or fried vegetables. Vegetables in a cheese sauce. Fruits Sweetened dried fruit. Canned fruit in syrup. Fruit juice. Meats and other protein foods Fatty cuts of meat. Ribs. Chicken wings. Berniece Salines. Sausage. Bologna. Salami. Chitterlings. Fatback. Hot dogs. Bratwurst. Packaged lunch meats. Dairy Whole or reduced-fat (2%) milk. Half-and-half. Cream cheese. Full-fat or sweetened yogurt. Full-fat cheese. Nondairy creamers. Whipped toppings. Processed cheese or cheese spreads. Cheese curds. Beverages Alcohol. Sweetened drinks, such as soda, lemonade, fruit drinks, or punches. Fats and oils Butter. Stick margarine. Lard. Shortening. Ghee. Bacon fat. Tropical oils, such as coconut, palm kernel, or palm oils. Sweets and desserts Corn syrup. Sugars. Honey. Molasses. Candy. Jam and jelly. Syrup. Sweetened cereals. Cookies. Pies. Cakes. Donuts. Muffins. Ice cream. Condiments Store-bought sauces, dressings, and marinades that are high in sugar,  such as ketchup and barbecue sauce. Summary  High levels of triglycerides can increase the risk of heart disease and stroke. Choosing the right foods can help lower your triglycerides.  Eat plenty of fresh fruits, vegetables, and whole grains. Choose low-fat dairy and lean meats. Eat fatty fish at least twice a week.  Avoid processed and prepackaged foods with added sugar, sodium, saturated fat, and trans fat.  If you need suggestions or have questions about what types of food are good for you, talk with your health care provider or a dietitian. This information is not intended to replace advice given to you by your health care provider. Make sure you discuss any questions you have with your health care provider. Document Revised: 04/09/2017 Document Reviewed: 06/30/2016 Elsevier Patient Education  2020 [employer].

## 2019-08-07 ENCOUNTER — Other Ambulatory Visit: Payer: Self-pay | Admitting: Adult Health

## 2019-08-07 DIAGNOSIS — E785 Hyperlipidemia, unspecified: Secondary | ICD-10-CM

## 2019-08-07 MED ORDER — ATORVASTATIN CALCIUM 80 MG PO TABS: ORAL_TABLET | ORAL | 1 refills | Status: DC

## 2019-09-26 NOTE — Progress Notes (Signed)
Assessment and Plan:  Mike Kramer was seen today for hip pain.  Diagnoses and all orders for this visit:  Right hip pain Progressive deep hip pain x 2-3 months, not improving with NSAIDs, rest Stiffness and worse after resting, unilateral Exam shows maintained ROM but describing deep pain likely in joint Discussed obtaining plain xrays today vs ortho referral  As progressive despite conservative treatment patient prefers to proceed with ortho evaluation. Referral placed, given meloxicam with instructions.  -     Ambulatory referral to Orthopedics -     meloxicam (MOBIC) 15 MG tablet; Take 1/2 - one daily as needed with food for hip pain, can take with tylenol, can not take with aleve, iburpofen.  Further disposition pending results of labs. Discussed med's effects and SE's.   Over 15 minutes of exam, counseling, chart review, and critical decision making was performed.   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 Played foot ball in high school. Denies hx of injury or joint problems.   He reports 2-3 months of deep R hip pain, gradual onset, started noting very stiff and uncomfortable in the morning, then progressive over the last 2-3 months, now painful after rest or extended sitting (throbbing, lateral/anterior hip, deep, occasionally has sense will have dull pain extending down lateral thigh to knee). Recently has progressed to having up to 10/10 pain, typically just stiff when waking up, then 30-40 min later by the time getting to work is severe. He has been avoiding sleeping on the side, but denies being woken up at night by rolling over on hip. Denies lower back pain, numbness/tingling,  weakness.   He reports walking and moving helps the pain, sitting/resting for extended periods makes the discomfort fade to an ache, but then much worse when he gets up.   He has taken ibuprofen 800 mg in the morning which does help control pain during the day to 1-2/10. However pain seems progressive and not improving.   Has tried stretching exercises - seemed to worsen pain.   BMI is Body mass index is 29.86 kg/m., he has been working on diet and exercise. Weight is down from 315 lb in 09/2017, following keto diet and feeling well, goal to get <200 lb Wt Readings from Last 3 Encounters:  09/27/19 202 lb 3.2 oz (91.7 kg)  08/01/19 207 lb 6.4 oz (94.1 kg)  01/04/19 264 lb (119.7 kg)     No past medical history on file.   Allergies  Allergen Reactions  . Penicillins Hives    Childhood allergy    Current Outpatient Medications on File Prior to Visit  Medication Sig  . atorvastatin (LIPITOR) 80 MG tablet Take 1 tablet Daily for Cholesterol  . Cholecalciferol (VITAMIN D) 50 MCG (2000 UT) tablet Take 2 tablets (4,000 Units total) by mouth daily.  . fluticasone (FLONASE) 50 MCG/ACT nasal spray Place 1 spray into both nostrils daily. (Patient taking differently: Place 1 spray into both nostrils as needed. )  . sertraline (ZOLOFT) 100 MG tablet TAKE 1 TABLET BY MOUTH EVERY DAY FOR MOOD   No current facility-administered medications on file prior to visit.    ROS: all negative except above.   Physical Exam:  BP 118/80   Pulse 64   Temp 97.7 F (36.5 C)  Ht 5\' 9"  (1.753 m)   Wt 202 lb 3.2 oz (91.7 kg)   SpO2 96%   BMI 29.86 kg/m   General Appearance: Well nourished, in no apparent distress. Eyes: conjunctiva no swelling or erythema ENT/Mouth: Mask in place; Hearing normal.  Neck: Supple Respiratory: Respiratory effort normal Cardio: RRR with no MRGs. Brisk peripheral pulses without edema.  Abdomen: Soft, + BS.  Non tender, no guarding, rebound, hernias,  masses. Lymphatics: Non tender without lymphadenopathy.  Musculoskeletal: Full ROM, hips symmetrical, 5/5 strength, normal gait. No lateral trochaneter tenderness, lumbar or SI joint tenderness, no popping/clicking/grinding in hip with ROM however patient reports deep pain with ROM, no soft tissue tenderness Skin: Warm, dry without rashes, lesions, ecchymosis.  Neuro: Normal muscle tone, Sensation intact.  Psych: Awake and oriented X 3, normal affect, Insight and Judgment appropriate.     Mike Ribas, NP 9:24 AM [facility]

## 2019-09-27 ENCOUNTER — Encounter: Payer: Self-pay | Admitting: Adult Health

## 2019-09-27 ENCOUNTER — Ambulatory Visit: Payer: BC Managed Care – PPO | Admitting: Adult Health

## 2019-09-27 ENCOUNTER — Other Ambulatory Visit: Payer: Self-pay

## 2019-09-27 VITALS — BP 118/80 | HR 64 | Temp 97.7°F | Ht 69.0 in | Wt 202.2 lb

## 2019-09-27 DIAGNOSIS — M25551 Pain in right hip: Secondary | ICD-10-CM

## 2019-09-27 HISTORY — DX: Morbid (severe) obesity due to excess calories: E66.01

## 2019-09-27 MED ORDER — MELOXICAM 15 MG PO TABS: ORAL_TABLET | ORAL | 1 refills | Status: DC

## 2019-09-27 NOTE — Patient Instructions (Signed)
Hip Pain The hip is the joint between the upper legs and the lower pelvis. The bones, cartilage, tendons, and muscles of your hip joint support your body and allow you to move around. Hip pain can range from a minor ache to severe pain in one or both of your hips. The pain may be felt on the inside of the hip joint near the groin, or on the outside near the buttocks and upper thigh. You may also have swelling or stiffness in your hip area. Follow these instructions at home: Managing pain, stiffness, and swelling      If directed, put ice on the painful area. To do this: ? Put ice in a plastic bag. ? Place a towel between your skin and the bag. ? Leave the ice on for 20 minutes, 2-3 times a day.  If directed, apply heat to the affected area as often as told by your health care provider. Use the heat source that your health care provider recommends, such as a moist heat pack or a heating pad. ? Place a towel between your skin and the heat source. ? Leave the heat on for 20-30 minutes. ? Remove the heat if your skin turns bright red. This is especially important if you are unable to feel pain, heat, or cold. You may have a greater risk of getting burned. Activity  Do exercises as told by your health care provider.  Avoid activities that cause pain. General instructions   Take over-the-counter and prescription medicines only as told by your health care provider.  Keep a journal of your symptoms. Write down: ? How often you have hip pain. ? The location of your pain. ? What the pain feels like. ? What makes the pain worse.  Sleep with a pillow between your legs on your most comfortable side.  Keep all follow-up visits as told by your health care provider. This is important. Contact a health care provider if:  You cannot put weight on your leg.  Your pain or swelling continues or gets worse after one week.  It gets harder to walk.  You have a fever. Get help right away  if:  You fall.  You have a sudden increase in pain and swelling in your hip.  Your hip is red or swollen or very tender to touch. Summary  Hip pain can range from a minor ache to severe pain in one or both of your hips.  The pain may be felt on the inside of the hip joint near the groin, or on the outside near the buttocks and upper thigh.  Avoid activities that cause pain.  Write down how often you have hip pain, the location of the pain, what makes it worse, and what it feels like. This information is not intended to replace advice given to you by your health care provider. Make sure you discuss any questions you have with your health care provider. Document Revised: 09/12/2018 Document Reviewed: 09/12/2018 Elsevier Patient Education  2020 Elsevier Inc.     Meloxicam tablets What is this medicine? MELOXICAM (mel OX i cam) is a non-steroidal anti-inflammatory drug (NSAID). It is used to reduce swelling and to treat pain. It may be used for osteoarthritis, rheumatoid arthritis, or juvenile rheumatoid arthritis. This medicine may be used for other purposes; ask your health care provider or pharmacist if you have questions. COMMON BRAND NAME(S): Mobic What should I tell my health care provider before I take this medicine? They need  to know if you have any of these conditions:  bleeding disorders  cigarette smoker  coronary artery bypass graft (CABG) surgery within the past 2 weeks  drink more than 3 alcohol-containing drinks per day  heart disease  high blood pressure  history of stomach bleeding  kidney disease  liver disease  lung or breathing disease, like asthma  stomach or intestine problems  an unusual or allergic reaction to meloxicam, aspirin, other NSAIDs, other medicines, foods, dyes, or preservatives  pregnant or trying to get pregnant  breast-feeding How should I use this medicine? Take this medicine by mouth with a full glass of water. Follow the  directions on the prescription label. You can take it with or without food. If it upsets your stomach, take it with food. Take your medicine at regular intervals. Do not take it more often than directed. Do not stop taking except on your doctor's advice. A special MedGuide will be given to you by the pharmacist with each prescription and refill. Be sure to read this information carefully each time. Talk to your pediatrician regarding the use of this medicine in children. While this drug may be prescribed for selected conditions, precautions do apply. Patients over 4 years old may have a stronger reaction and need a smaller dose. Overdosage: If you think you have taken too much of this medicine contact a poison control center or emergency room at once. NOTE: This medicine is only for you. Do not share this medicine with others. What if I miss a dose? If you miss a dose, take it as soon as you can. If it is almost time for your next dose, take only that dose. Do not take double or extra doses. What may interact with this medicine? Do not take this medicine with any of the following medications:  cidofovir  ketorolac This medicine may also interact with the following medications:  aspirin and aspirin-like medicines  certain medicines for blood pressure, heart disease, irregular heart beat  certain medicines for depression, anxiety, or psychotic disturbances  certain medicines that treat or prevent blood clots like warfarin, enoxaparin, dalteparin, apixaban, dabigatran, rivaroxaban  cyclosporine  diuretics  fluconazole  lithium  methotrexate  other NSAIDs, medicines for pain and inflammation, like ibuprofen and naproxen  pemetrexed This list may not describe all possible interactions. Give your health care provider a list of all the medicines, herbs, non-prescription drugs, or dietary supplements you use. Also tell them if you smoke, drink alcohol, or use illegal drugs. Some items  may interact with your medicine. What should I watch for while using this medicine? Tell your doctor or healthcare provider if your symptoms do not start to get better or if they get worse. This medicine may cause serious skin reactions. They can happen weeks to months after starting the medicine. Contact your healthcare provider right away if you notice fevers or flu-like symptoms with a rash. The rash may be red or purple and then turn into blisters or peeling of the skin. Or, you might notice a red rash with swelling of the face, lips or lymph nodes in your neck or under your arms. Do not take other medicines that contain aspirin, ibuprofen, or naproxen with this medicine. Side effects such as stomach upset, nausea, or ulcers may be more likely to occur. Many medicines available without a prescription should not be taken with this medicine. This medicine can cause ulcers and bleeding in the stomach and intestines at any time during treatment. This can  happen with no warning and may cause death. There is increased risk with taking this medicine for a long time. Smoking, drinking alcohol, older age, and poor health can also increase risks. Call your doctor right away if you have stomach pain or blood in your vomit or stool. This medicine does not prevent heart attack or stroke. In fact, this medicine may increase the chance of a heart attack or stroke. The chance may increase with longer use of this medicine and in people who have heart disease. If you take aspirin to prevent heart attack or stroke, talk with your doctor or healthcare provider. What side effects may I notice from receiving this medicine? Side effects that you should report to your doctor or health care professional as soon as possible:  allergic reactions like skin rash, itching or hives, swelling of the face, lips, or tongue  nausea, vomiting  redness, blistering, peeling, or loosening of the skin, including inside the mouth  signs  and symptoms of a blood clot such as breathing problems; changes in vision; chest pain; severe, sudden headache; pain, swelling, warmth in the leg; trouble speaking; sudden numbness or weakness of the face, arm, or leg  signs and symptoms of bleeding such as bloody or black, tarry stools; red or dark-brown urine; spitting up blood or brown material that looks like coffee grounds; red spots on the skin; unusual bruising or bleeding from the eye, gums, or nose  signs and symptoms of liver injury like dark yellow or brown urine; general ill feeling or flu-like symptoms; light-colored stools; loss of appetite; nausea; right upper belly pain; unusually weak or tired; yellowing of the eyes or skin  signs and symptoms of stroke like changes in vision; confusion; trouble speaking or understanding; severe headaches; sudden numbness or weakness of the face, arm, or leg; trouble walking; dizziness; loss of balance or coordination Side effects that usually do not require medical attention (report to your doctor or health care professional if they continue or are bothersome):  constipation  diarrhea  gas This list may not describe all possible side effects. Call your doctor for medical advice about side effects. You may report side effects to FDA at 1-800-FDA-1088. Where should I keep my medicine? Keep out of the reach of children. Store at room temperature between 15 and 30 degrees C (59 and 86 degrees F). Throw away any unused medicine after the expiration date. NOTE: This sheet is a summary. It may not cover all possible information. If you have questions about this medicine, talk to your doctor, pharmacist, or health care provider.  2020 Elsevier/Gold Standard (2018-07-27 11:21:28)

## 2019-10-05 ENCOUNTER — Other Ambulatory Visit: Payer: Self-pay | Admitting: Adult Health

## 2019-10-05 DIAGNOSIS — B351 Tinea unguium: Secondary | ICD-10-CM

## 2019-10-05 MED ORDER — TERBINAFINE HCL 250 MG PO TABS: ORAL_TABLET | ORAL | 0 refills | Status: DC

## 2019-10-11 ENCOUNTER — Ambulatory Visit: Payer: BLUE CROSS/BLUE SHIELD | Admitting: Orthopaedic Surgery

## 2019-10-16 ENCOUNTER — Ambulatory Visit: Payer: BLUE CROSS/BLUE SHIELD | Admitting: Orthopaedic Surgery

## 2019-11-27 ENCOUNTER — Other Ambulatory Visit: Payer: Self-pay | Admitting: Adult Health

## 2019-11-27 DIAGNOSIS — M25551 Pain in right hip: Secondary | ICD-10-CM

## 2019-12-27 ENCOUNTER — Encounter: Payer: BLUE CROSS/BLUE SHIELD | Admitting: Adult Health

## 2020-01-03 NOTE — Progress Notes (Deleted)
Complete Physical  Assessment and Plan:  Mike Kramer was seen today for annual exam.  Diagnoses and all orders for this visit:  Encounter for routine adult health examination without abnormal findings  Essential hypertension Controlled off of medication Monitor blood pressure at home; call if consistently over 130/80 Continue DASH diet.   Reminder to go to the ER if any CP, SOB, nausea, dizziness, severe HA, changes vision/speech, left arm numbness and tingling and jaw pain. -     COMPLETE METABOLIC PANEL WITH GFR -     TSH -     Urinalysis w microscopic + reflex cultur -     Microalbumin / creatinine urine ratio -     EKG 12-Lead  OSA (obstructive sleep apnea) Wears CPAP, 100%, working well   Vitamin D deficiency Below goal at recent check;  continue to recommend supplementation for goal of 60-100 Check vitamin D level  Other abnormal glucose Discussed disease and risks Discussed diet/exercise, weight management  A1C -     Hemoglobin A1c  Overweight- BMI *** Excellent progress with modified keto Long discussion about weight loss, diet, and exercise Recommended diet heavy in fruits and veggies and low in animal meats, cheeses, and dairy products, appropriate calorie intake Discussed appropriate weight for height - current end goal is 200 lb Continue with lifestyle modification, discussed will hit plateau, discussed strategies, can follow up and discuss medications if needed.   Follow up at next visit in 6 months  Mixed hyperlipidemia Continue medications: atorvastatin 40 mg every other day Continue low cholesterol diet and exercise.  Check lipid panel.  -     Lipid panel -     TSH  Anxiety Well managed by current regimen; continue medications Stress management techniques discussed, increase water, good sleep hygiene discussed, increase exercise, and increase veggies.  -     TSH  Medication management -     CBC with Differential/Platelet -     COMPLETE METABOLIC PANEL  WITH GFR -     Magnesium -     Urinalysis w microscopic   Left toe onychomycosis ***   Discussed med's effects and SE's. Screening labs and tests as requested with regular follow-up as recommended.  Future Appointments  Date Time Provider Department Center  01/04/2020  2:00 PM [name], NP [facility] None     HPI Patient presents for a complete physical. He has Labile HTN; Overweight (BMI 25.0-29.9); Hyperlipidemia; Vitamin D deficiency; OSA (obstructive sleep apnea); Other abnormal glucose (hx of prediabetes); Anxiety; Onychomycosis of great toe; and Right hip pain on their problem list.   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    He is on zoloft 100 mg daily for anxiety/OCD tendencies and feels he is doing well on this dose.   He has OSA on CPAP, endorses 100% compliance with restorative sleep,.   He has suspected L toe onycomychosis, currently doing second course alternating months.   BMI is There is no height or weight on file to calculate BMI., he has been working on diet and exercise, pushing water, watching portions, doing a modified keto hard start since June 2020 with his wife with excellent progress this past year. Goal is 200 lb or so.  He is walking a lot at work 10000-12000 steps daily, 4-5 hours in a paddle boat weekly.  Drinks 6-7 bottles of water daily  Caffeine: 1 caffeinated drink daily  Sleep: good, 6-7 hours  Wt Readings from Last 3 Encounters:  09/27/19 202 lb 3.2 oz (91.7 kg)  08/01/19 207 lb 6.4 oz (94.1 kg)  01/04/19 264 lb (119.7 kg)   His blood pressure has been controlled at home, today their BP is   He does workout. He denies chest pain, shortness of breath, dizziness.   He is on cholesterol medication (atorvastatin 40 mg *** every other day) and denies myalgias. His LDL cholesterol is not at goal. The cholesterol last visit was:   Lab Results  Component Value Date   CHOL 170 08/01/2019   HDL 44 08/01/2019   LDLCALC 108 (H)  08/01/2019   TRIG 85 08/01/2019   CHOLHDL 3.9 08/01/2019   He has been working on diet/exercise for hx of prediabetes.  Last A1C in the office was:  Lab Results  Component Value Date   HGBA1C 5.1 01/04/2019   Patient is on Vitamin D supplement, unsure of dose but has increased.  Lab Results  Component Value Date   VD25OH 46 08/01/2019   Last PSA was: Lab Results  Component Value Date   PSA 0.2 12/20/2017  .  Denies BPH symptoms daytime frequency, double voiding, dysuria, hematuria, hesitancy, incontinence, intermittency, nocturia, sensation of incomplete bladder emptying, suprapubic pain, urgency or weak urinary stream.   Lab Results  Component Value Date   VITAMINB12 455 01/04/2019   Lab Results  Component Value Date   IRON 126 02/20/2016   TIBC 358 02/20/2016     Current Medications:  Current Outpatient Medications on File Prior to Visit  Medication Sig Dispense Refill  . atorvastatin (LIPITOR) 80 MG tablet Take 1 tablet Daily for Cholesterol 90 tablet 1  . Cholecalciferol (VITAMIN D) 50 MCG (2000 UT) tablet Take 2 tablets (4,000 Units total) by mouth daily.    . fluticasone (FLONASE) 50 MCG/ACT nasal spray Place 1 spray into both nostrils daily. (Patient taking differently: Place 1 spray into both nostrils as needed. ) 16 g 2  . meloxicam (MOBIC) 15 MG tablet 1/2 TO 1 TABLET BY MOUTH DAILY AS NEEDED WITH FOOD FOR HIP PAIN. CAN TAKE WITH TYLENOL. 30 tablet 1  . sertraline (ZOLOFT) 100 MG tablet TAKE 1 TABLET BY MOUTH EVERY DAY FOR MOOD 90 tablet 1  . terbinafine (LAMISIL) 250 MG tablet Take one daily for 1 month followed by 1 month of no medication, repeat for 3 cycles. Need liver function labs routinely. 90 tablet 0   No current facility-administered medications on file prior to visit.    Health Maintenance:  Immunization History  Administered Date(s) Administered  . Influenza,inj,quad, With Preservative 02/20/2016  . PFIZER SARS-COV-2 Vaccination 08/01/2019,  08/24/2019  . Tetanus 02/20/2016    Tetanus: 02/20/16 Flu vaccine: 2017 Covid 19: 2/2, 2021, pfizer  Eye Exam:  [place], ? Dr. , last visit 2020, wears glasses, goes annually Dentist:  Dr. Elroy Channel, Twice yearly visits, 2020, goes q71m Derm ***  Patient Care Team: Lucky Cowboy, MD as PCP - General (Internal Medicine)  Allergies:  Allergies  Allergen Reactions  . Penicillins Hives    Childhood allergy    Medical History:  Past Medical History:  Diagnosis Date  . Morbid obesity with BMI of 45.0-49.9, adult (HCC) 09/27/2019    Surgical History:  Past Surgical History:  Procedure Laterality Date  . KNEE SURGERY Right 1991   arthroscopic, "cleaning out"   . ORIF FINGER FRACTURE Right 2017   right pinky  . TONSILLECTOMY Bilateral 1983  . VASECTOMY  2014   ? Dr. Vernie Ammons    Family History:  Family History  Problem Relation Age of Onset  . Skin cancer Father 39       ? melanoma  . Cancer Paternal Grandfather     Social History:   Social History   Tobacco Use  . Smoking status: Never Smoker  . Smokeless tobacco: Never Used  Vaping Use  . Vaping Use: Never used  Substance Use Topics  . Alcohol use: Yes    Alcohol/week: 0.0 standard drinks    Comment: occasional  . Drug use: No    Review of Systems:  Review of Systems  Constitutional: Negative for malaise/fatigue and weight loss.  HENT: Negative for hearing loss and tinnitus.   Eyes: Negative for blurred vision and double vision.  Respiratory: Negative for cough, sputum production, shortness of breath and wheezing.   Cardiovascular: Negative for chest pain, palpitations, orthopnea, claudication, leg swelling and PND.  Gastrointestinal: Negative for abdominal pain, blood in stool, constipation, diarrhea, heartburn, melena, nausea and vomiting.  Genitourinary: Negative.   Musculoskeletal: Negative for joint pain and myalgias.  Skin: Negative for rash.  Neurological: Negative for dizziness, tingling,  sensory change, weakness and headaches.  Endo/Heme/Allergies: Negative for polydipsia.  Psychiatric/Behavioral: Negative.  Negative for depression, memory loss, substance abuse and suicidal ideas. The patient is not nervous/anxious and does not have insomnia.   All other systems reviewed and are negative.   Physical Exam: Estimated body mass index is 29.86 kg/m as calculated from the following:   Height as of 09/27/19: 5\' 9"  (1.753 m).   Weight as of 09/27/19: 202 lb 3.2 oz (91.7 kg). There were no vitals taken for this visit.  General Appearance: Well nourished, in no apparent distress.  Eyes: PERRLA, EOMs, conjunctiva no swelling or erythema ENT/Mouth: Ear canals clear bilaterally with no erythema, swelling, discharge.  TMs normal bilaterally with no erythema, bulging, or retractions.  Oropharynx clear and moist with no exudate, swelling, or erythema.  Dentition normal.   Neck: Supple, thyroid normal. No bruits, JVD, cervical adenopathy Respiratory: Respiratory effort normal, BS equal bilaterally without rales, rhonchi, wheezing or stridor.  Cardio: RRR without murmurs, rubs or gallops. Brisk peripheral pulses without edema.  Chest: symmetric, with normal excursions Abdomen: Soft, nontender, no guarding, rebound, hernias, masses, or organomegaly. Genitourinary: *** Musculoskeletal: Full ROM all peripheral extremities,5/5 strength, and normal gait.  Skin: Warm, dry without rashes, lesions, ecchymosis. Left great toe thickened and yellow medially, not extending to base *** Neuro: A&Ox3, Cranial nerves intact, reflexes equal bilaterally. Normal muscle tone, no cerebellar symptoms. Sensation intact.  Psych: Normal affect, Insight and Judgment appropriate.   EKG: Sinus brady, NSCPT  Over 40 minutes of exam, counseling, chart review and critical decision making was performed  09/29/19 1:07 PM [facility]

## 2020-01-04 ENCOUNTER — Encounter: Payer: BLUE CROSS/BLUE SHIELD | Admitting: Adult Health

## 2020-01-13 ENCOUNTER — Other Ambulatory Visit: Payer: Self-pay | Admitting: Adult Health

## 2020-01-13 DIAGNOSIS — F32A Depression, unspecified: Secondary | ICD-10-CM

## 2020-01-26 NOTE — Progress Notes (Deleted)
Complete Physical  Assessment and Plan:  Mike Kramer was seen today for annual exam.  Diagnoses and all orders for this visit:  Encounter for routine adult health examination without abnormal findings  Essential hypertension Controlled off of medication Monitor blood pressure at home; call if consistently over 130/80 Continue DASH diet.   Reminder to go to the ER if any CP, SOB, nausea, dizziness, severe HA, changes vision/speech, left arm numbness and tingling and jaw pain. -     COMPLETE METABOLIC PANEL WITH GFR -     TSH -     Urinalysis w microscopic + reflex cultur -     Microalbumin / creatinine urine ratio -     EKG 12-Lead  OSA (obstructive sleep apnea) Wears CPAP, 100%, working well   Vitamin D deficiency Below goal at recent check;  continue to recommend supplementation for goal of 60-100 Check vitamin D level  Other abnormal glucose Discussed disease and risks Discussed diet/exercise, weight management  A1C -     Hemoglobin A1c  Overweight- BMI *** Excellent progress with modified keto Long discussion about weight loss, diet, and exercise Recommended diet heavy in fruits and veggies and low in animal meats, cheeses, and dairy products, appropriate calorie intake Discussed appropriate weight for height - current end goal is 200 lb Continue with lifestyle modification, discussed will hit plateau, discussed strategies, can follow up and discuss medications if needed.   Follow up at next visit in 6 months  Mixed hyperlipidemia Continue medications: atorvastatin 40 mg every other day Continue low cholesterol diet and exercise.  Check lipid panel.  -     Lipid panel -     TSH  Anxiety Well managed by current regimen; continue medications Stress management techniques discussed, increase water, good sleep hygiene discussed, increase exercise, and increase veggies.  -     TSH  Medication management -     CBC with Differential/Platelet -     COMPLETE METABOLIC PANEL  WITH GFR -     Magnesium -     Urinalysis w microscopic   Left toe onychomycosis ***   Discussed med's effects and SE's. Screening labs and tests as requested with regular follow-up as recommended.  Future Appointments  Date Time Provider Department Center  01/29/2020  3:00 PM Mike Gaudier, NP [facility] None  01/29/2021  3:00 PM Mike Gaudier, NP [facility] None     HPI Patient presents for a complete physical. He has Labile HTN; Overweight (BMI 25.0-29.9); Hyperlipidemia; Vitamin D deficiency; OSA (obstructive sleep apnea); Other abnormal glucose (hx of prediabetes); Anxiety; Onychomycosis of great toe; and Right hip pain on their problem list.   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    He is on zoloft 100 mg daily for anxiety/OCD tendencies and feels he is doing well on this dose.   He has OSA on CPAP, endorses 100% compliance with restorative sleep,.   He has suspected L toe onycomychosis, currently doing second course alternating months.   BMI is There is no height or weight on file to calculate BMI., he has been working on diet and exercise, pushing water, watching portions, doing a modified keto hard start since June 2020 with his wife with excellent progress this past year. Goal is 200 lb or so.  He is walking a lot at work 10000-12000 steps daily, 4-5 hours in a paddle boat weekly.  Drinks 6-7 bottles of water daily  Caffeine: 1 caffeinated drink daily  Sleep: good,  6-7 hours  Wt Readings from Last 3 Encounters:  09/27/19 202 lb 3.2 oz (91.7 kg)  08/01/19 207 lb 6.4 oz (94.1 kg)  01/04/19 264 lb (119.7 kg)   His blood pressure has been controlled at home, today their BP is   He does workout. He denies chest pain, shortness of breath, dizziness.   He is on cholesterol medication (atorvastatin 40 mg *** every other day) and denies myalgias. His LDL cholesterol is not at goal. The cholesterol last visit was:   Lab Results  Component Value Date    CHOL 170 08/01/2019   HDL 44 08/01/2019   LDLCALC 108 (H) 08/01/2019   TRIG 85 08/01/2019   CHOLHDL 3.9 08/01/2019   He has been working on diet/exercise for hx of prediabetes improved with weight loss.  Last A1C in the office was:  Lab Results  Component Value Date   HGBA1C 5.1 01/04/2019   Patient is on Vitamin D supplement, unsure of dose but has increased.  Lab Results  Component Value Date   VD25OH 46 08/01/2019   Last PSA was: Lab Results  Component Value Date   PSA 0.2 12/20/2017  .  Denies BPH symptoms daytime frequency, double voiding, dysuria, hematuria, hesitancy, incontinence, intermittency, nocturia, sensation of incomplete bladder emptying, suprapubic pain, urgency or weak urinary stream.    Current Medications:  Current Outpatient Medications on File Prior to Visit  Medication Sig Dispense Refill  . atorvastatin (LIPITOR) 80 MG tablet Take 1 tablet Daily for Cholesterol 90 tablet 1  . Cholecalciferol (VITAMIN D) 50 MCG (2000 UT) tablet Take 2 tablets (4,000 Units total) by mouth daily.    . fluticasone (FLONASE) 50 MCG/ACT nasal spray Place 1 spray into both nostrils daily. (Patient taking differently: Place 1 spray into both nostrils as needed. ) 16 g 2  . meloxicam (MOBIC) 15 MG tablet 1/2 TO 1 TABLET BY MOUTH DAILY AS NEEDED WITH FOOD FOR HIP PAIN. CAN TAKE WITH TYLENOL. 30 tablet 1  . sertraline (ZOLOFT) 100 MG tablet Take 1 tablet Daily for Mood 90 tablet 0  . terbinafine (LAMISIL) 250 MG tablet Take one daily for 1 month followed by 1 month of no medication, repeat for 3 cycles. Need liver function labs routinely. 90 tablet 0   No current facility-administered medications on file prior to visit.    Health Maintenance:  Immunization History  Administered Date(s) Administered  . Influenza,inj,quad, With Preservative 02/20/2016  . PFIZER SARS-COV-2 Vaccination 08/01/2019, 08/24/2019  . Tetanus 02/20/2016    Tetanus: 02/20/16 Flu vaccine: 2017 Covid 19:  2/2, 2021, pfizer  Eye Exam:  [place], ? Dr. , last visit 2020, wears glasses, goes annually Dentist:  Dr. Elroy Channel, Twice yearly visits, 2020, goes q67m Derm ***  Patient Care Team: Mike Cowboy, MD as PCP - General (Internal Medicine)  Allergies:  Allergies  Allergen Reactions  . Penicillins Hives    Childhood allergy    Medical History:  Past Medical History:  Diagnosis Date  . Morbid obesity with BMI of 45.0-49.9, adult (HCC) 09/27/2019    Surgical History:  Past Surgical History:  Procedure Laterality Date  . KNEE SURGERY Right 1991   arthroscopic, "cleaning out"   . ORIF FINGER FRACTURE Right 2017   right pinky  . TONSILLECTOMY Bilateral 1983  . VASECTOMY  2014   ? Dr. Vernie Ammons    Family History:  Family History  Problem Relation Age of Onset  . Skin cancer Father 9       ?  melanoma  . Cancer Paternal Grandfather     Social History:   Social History   Tobacco Use  . Smoking status: Never Smoker  . Smokeless tobacco: Never Used  Vaping Use  . Vaping Use: Never used  Substance Use Topics  . Alcohol use: Yes    Alcohol/week: 0.0 standard drinks    Comment: occasional  . Drug use: No    Review of Systems:  Review of Systems  Constitutional: Negative for malaise/fatigue and weight loss.  HENT: Negative for hearing loss and tinnitus.   Eyes: Negative for blurred vision and double vision.  Respiratory: Negative for cough, sputum production, shortness of breath and wheezing.   Cardiovascular: Negative for chest pain, palpitations, orthopnea, claudication, leg swelling and PND.  Gastrointestinal: Negative for abdominal pain, blood in stool, constipation, diarrhea, heartburn, melena, nausea and vomiting.  Genitourinary: Negative.   Musculoskeletal: Negative for joint pain and myalgias.  Skin: Negative for rash.  Neurological: Negative for dizziness, tingling, sensory change, weakness and headaches.  Endo/Heme/Allergies: Negative for polydipsia.   Psychiatric/Behavioral: Negative.  Negative for depression, memory loss, substance abuse and suicidal ideas. The patient is not nervous/anxious and does not have insomnia.   All other systems reviewed and are negative.   Physical Exam: Estimated body mass index is 29.86 kg/m as calculated from the following:   Height as of 09/27/19: 5\' 9"  (1.753 m).   Weight as of 09/27/19: 202 lb 3.2 oz (91.7 kg). There were no vitals taken for this visit.  General Appearance: Well nourished, in no apparent distress.  Eyes: PERRLA, EOMs, conjunctiva no swelling or erythema ENT/Mouth: Ear canals clear bilaterally with no erythema, swelling, discharge.  TMs normal bilaterally with no erythema, bulging, or retractions.  Oropharynx clear and moist with no exudate, swelling, or erythema.  Dentition normal.   Neck: Supple, thyroid normal. No bruits, JVD, cervical adenopathy Respiratory: Respiratory effort normal, BS equal bilaterally without rales, rhonchi, wheezing or stridor.  Cardio: RRR without murmurs, rubs or gallops. Brisk peripheral pulses without edema.  Chest: symmetric, with normal excursions Abdomen: Soft, nontender, no guarding, rebound, hernias, masses, or organomegaly. Genitourinary: *** Musculoskeletal: Full ROM all peripheral extremities,5/5 strength, and normal gait.  Skin: Warm, dry without rashes, lesions, ecchymosis. Left great toe thickened and yellow medially, not extending to base *** Neuro: A&Ox3, Cranial nerves intact, reflexes equal bilaterally. Normal muscle tone, no cerebellar symptoms. Sensation intact.  Psych: Normal affect, Insight and Judgment appropriate.   EKG: Sinus brady, NSCPT  Over 40 minutes of exam, counseling, chart review and critical decision making was performed  09/29/19 2:59 PM [facility]

## 2020-01-29 ENCOUNTER — Encounter: Payer: Self-pay | Admitting: Adult Health

## 2020-02-09 ENCOUNTER — Other Ambulatory Visit: Payer: Self-pay | Admitting: Adult Health

## 2020-02-09 DIAGNOSIS — M25551 Pain in right hip: Secondary | ICD-10-CM

## 2020-04-08 ENCOUNTER — Other Ambulatory Visit: Payer: Self-pay | Admitting: Internal Medicine

## 2020-04-08 DIAGNOSIS — F32A Depression, unspecified: Secondary | ICD-10-CM

## 2020-04-09 ENCOUNTER — Other Ambulatory Visit: Payer: Self-pay | Admitting: Adult Health Nurse Practitioner

## 2020-04-09 DIAGNOSIS — M25551 Pain in right hip: Secondary | ICD-10-CM

## 2020-05-06 ENCOUNTER — Other Ambulatory Visit: Payer: Self-pay | Admitting: Adult Health

## 2020-05-06 DIAGNOSIS — E785 Hyperlipidemia, unspecified: Secondary | ICD-10-CM

## 2020-05-21 ENCOUNTER — Other Ambulatory Visit: Payer: Self-pay

## 2020-05-23 ENCOUNTER — Ambulatory Visit: Payer: BC Managed Care – PPO | Admitting: Adult Health Nurse Practitioner

## 2020-05-23 ENCOUNTER — Encounter: Payer: Self-pay | Admitting: Adult Health Nurse Practitioner

## 2020-05-23 ENCOUNTER — Other Ambulatory Visit: Payer: Self-pay

## 2020-05-23 VITALS — HR 66 | Temp 98.8°F

## 2020-05-23 DIAGNOSIS — Z7189 Other specified counseling: Secondary | ICD-10-CM | POA: Diagnosis not present

## 2020-05-23 DIAGNOSIS — J0140 Acute pansinusitis, unspecified: Secondary | ICD-10-CM | POA: Diagnosis not present

## 2020-05-23 DIAGNOSIS — U071 COVID-19: Secondary | ICD-10-CM

## 2020-05-23 DIAGNOSIS — Z1152 Encounter for screening for COVID-19: Secondary | ICD-10-CM

## 2020-05-23 LAB — POC COVID19 BINAXNOW: SARS Coronavirus 2 Ag: POSITIVE — AB

## 2020-05-23 MED ORDER — DEXAMETHASONE 4 MG PO TABS: ORAL_TABLET | ORAL | 1 refills | Status: DC

## 2020-05-23 MED ORDER — DOXYCYCLINE HYCLATE 100 MG PO TABS: 100.0000 mg | ORAL_TABLET | Freq: Two times a day (BID) | ORAL | 0 refills | Status: AC

## 2020-05-23 NOTE — Progress Notes (Signed)
Virtual Visit via Telephone Note  I connected with Mike Kramer on 05/23/20 at  9:30 AM EST by telephone and verified that I am speaking with the correct person using two identifiers.   I discussed the limitations, risks, security and privacy concerns of performing an evaluation and management service by telephone and the availability of in person appointments. I also discussed with the patient that there may be a patient responsible charge related to this service. The patient expressed understanding and agreed to proceed.   Assessment and Plan:  Mike Kramer was seen today for covid exposure.  Diagnoses and all orders for this visit:  Encounter for screening for COVID-19 Educated about COVID-19 virus infection  Lab test positive for detection of COVID-19 virus -     POC COVID-19 BinaxNow - Pos  Acute non-recurrent pansinusitis -     doxycycline (VIBRA-TABS) 100 MG tablet; Take 1 tablet (100 mg total) by mouth 2 (two) times daily for 7 days. -     dexamethasone (DECADRON) 4 MG tablet; Take 1 tab 3 x day - 3 days, then 2 x day - 3 days, then 1 tab daily  Discussed symptom management and care.  Instruction sheet provided to patient as well as guidance for quarantine.  Patient is agreeable to plan of care.       Follow Up Instructions:    I discussed the assessment and treatment plan with the patient. The patient was provided an opportunity to ask questions and all were answered. The patient agreed with the plan and demonstrated an understanding of the instructions.   The patient was advised to call back or seek an in-person evaluation if the symptoms worsen or if the condition fails to improve as anticipated.  I provided 20 minutes of non-face-to-face time during this encounter including counseling, chart review, and critical decision making was preformed.   Future Appointments  Date Time Provider Department Center  05/28/2020  8:30 AM [facility] None  01/29/2021  3:00  PM [name], NP [facility] None      History of Present Illness:  50 y.o. male presents for evaluation for sore throat, burning scratchy one day ago.   He reports today he had a fever 99.9-100.  He reports slight headache when waking this am. Slight runny nose, intermittent cough dry but related to dry throat.  Report he too Ibuprofen 3 tablets this am which helped with the fever.    Denies any otalgia or fullness, nausea, vomiting.    Medical History:  Past Medical History:  Diagnosis Date  . Morbid obesity with BMI of 45.0-49.9, adult (HCC) 09/27/2019    Current Medications:  Current Outpatient Medications on File Prior to Visit  Medication Sig  . atorvastatin (LIPITOR) 80 MG tablet Take     1 tablet     Daily       for Cholesterol  . Cholecalciferol (VITAMIN D) 50 MCG (2000 UT) tablet Take 2 tablets (4,000 Units total) by mouth daily.  . fluticasone (FLONASE) 50 MCG/ACT nasal spray Place 1 spray into both nostrils daily. (Patient taking differently: Place 1 spray into both nostrils as needed.)  . meloxicam (MOBIC) 15 MG tablet 1/2 TO 1 TABLET BY MOUTH DAILY AS NEEDED WITH FOOD FOR HIP PAIN. CAN TAKE WITH TYLENOL.  Marland Kitchen sertraline (ZOLOFT) 100 MG tablet TAKE 1 TABLET BY MOUTH DAILY FOR MOOD  . terbinafine (LAMISIL) 250 MG tablet Take one daily for 1 month followed by 1 month of no medication, repeat  for 3 cycles. Need liver function labs routinely.   No current facility-administered medications on file prior to visit.    Allergies:  Allergies  Allergen Reactions  . Penicillins Hives    Childhood allergy      Review of Systems:  ROS   Observations/Objective: General : Well sounding patient in no apparent distress HEENT: no hoarseness, no cough for duration of visit Lungs: speaks in complete sentences, no audible wheezing, no apparent distress Neurological: alert, oriented x 3 Psychiatric: pleasant, judgement appropriate      Elder Negus, NP [facility]  [facility] Adolescent Internal Medicine 05/23/2020  9:56 AM ~

## 2020-05-23 NOTE — Patient Instructions (Addendum)
   We have sent in Doxycycline 100mg  take one tablet twice a day.  A Dexamethasone steroid taper has been sent to the pharmacy for you to help reduce inflammation.  Start taking this if your symptoms worsen.   At the pharmacy purchase and Oxygen sensor.  Goal is 93% or higher.  Make sure your finger is warm.  Nail polish may alter readings.  IF reading is less that 93% take a few deep breaths in through your nose and out your mouth to increase the readings.  **IF you are less than 90% and short of breath please SEEK IMMEDIATE ATTENTION via 911.**  Chest Congestion: Continue Taking the Mucinex DM every 12 hours.  You can try humidification or Warm steamy shower to help loosen chest.  Cough: You may take the Delsym cough syrup, be mindful of timing as there is a small amount of this in the Mucinex DM.  Sore throat For your sore throat, gargle salt water and spit out.  Throat lozengers or chloraseptic spray.  Runny nose, ear fullness Take a Cetirizine (Zyrtec) every night to help dry up any fluid draining in back of your throat.  Nasal Congestion: Take Sudafed as directed.  If you take blood pressure medication, monitor your blood pressure as it can increase if taking this medication.   Continue to take Vitamin D supplement to help your immune system.  Stay hydrated drinking lots of water or some Gatorade.  IF your taste and smell is absent, be mindful of eating salty food or adding salt to foods.  You will be able to taste salt OR the first to return so be careful as this can cause you to retain fluid and increase blood pressure.

## 2020-05-28 ENCOUNTER — Other Ambulatory Visit: Payer: Self-pay

## 2020-06-05 ENCOUNTER — Other Ambulatory Visit: Payer: Self-pay

## 2020-06-05 ENCOUNTER — Other Ambulatory Visit: Payer: BC Managed Care – PPO

## 2020-06-30 ENCOUNTER — Other Ambulatory Visit: Payer: Self-pay | Admitting: Adult Health Nurse Practitioner

## 2020-06-30 DIAGNOSIS — F32A Depression, unspecified: Secondary | ICD-10-CM

## 2020-07-08 NOTE — Progress Notes (Deleted)
FOLLOW UP  Assessment and Plan:   Cholesterol Currently at goal; continue statin  Continue low cholesterol diet and exercise.  Check lipid panel.   Hx of prediabetes Recent A1Cs at goal Discussed diet/exercise, weight management  Defer A1C; check CMP  Obesity - BMI 30 with co morbidities - chol, OSA Commended excellent weight loss progress, down 100 lb in last year with keto Risks of keto discussed, reviewed healthier modifications Long discussion about weight loss, diet, and exercise Recommended diet heavy in fruits and veggies and low in animal meats, cheeses, and dairy products, appropriate calorie intake Discussed ideal weight for height (below <170 lb) and initial weight goal (<200 lb) Will follow up in 3 months  Vitamin D Def Below goal at last visit; increase to 7000 IU continue supplementation to maintain goal of 60-100 Defer Vit D level  Depression/anxiety Continue medications  Lifestyle discussed: diet/exerise, sleep hygiene, stress management, hydration  L toe onychomychosis ? Improvement with first cycle of lamisil Monitor as toe nail grows for another 3-6 months Repeat with 3 cycles of 1 month/on and off if needed.   Continue diet and meds as discussed. Further disposition pending results of labs. Discussed med's effects and SE's.   Over 30 minutes of exam, counseling, chart review, and critical decision making was performed.   Future Appointments  Date Time Provider Department Center  07/10/2020 10:30 AM [name], NP [facility] None  01/29/2021  3:00 PM [name], NP [facility] None    ----------------------------------------------------------------------------------------------------------------------  HPI 50 y.o. male  presents for 6 month follow up on cholesterol,  Weight, mood, and vitamin D deficiency.   He had L great toe nail fungus x 3 months last fall, repeated ***  He is on zoloft 100 mg daily for mood/OCD and feels he is doing  well on this dose.   He has OSA on CPAP, endorses 100% compliance with restorative sleep.   BMI is There is no height or weight on file to calculate BMI., he has been working on diet and exercise, pushing water, watching portions, doing a modified keto hard start since June 2020 with his wife.  No carb, no sugars, veggies and lean protein He is aiming to get to 200 lb or so.  He is walking a lot at work 10000-12000 steps daily, 4-5 hours in a paddle boat weekly when warm Drinks 6-7 bottles of water daily  Caffeine: 1 caffeinated drink daily  Sleep: good, 6-7 hours  Wt Readings from Last 3 Encounters:  09/27/19 202 lb 3.2 oz (91.7 kg)  08/01/19 207 lb 6.4 oz (94.1 kg)  01/04/19 264 lb (119.7 kg)   Today their BP is    He does workout. He denies chest pain, shortness of breath, dizziness.   He is on cholesterol medication Atorvastatin 80 mg every other day and denies myalgias. His cholesterol is not at goal. The cholesterol last visit was:   Lab Results  Component Value Date   CHOL 170 08/01/2019   HDL 44 08/01/2019   LDLCALC 108 (H) 08/01/2019   TRIG 85 08/01/2019   CHOLHDL 3.9 08/01/2019    He has been working on diet and exercise for glucose management (hx of prediabetes, a1c 5.7% IN 2017), and denies nausea, paresthesia of the feet, polydipsia, polyuria, visual disturbances and vomiting. Last A1C in the office was:  Lab Results  Component Value Date   HGBA1C 5.1 01/04/2019    Last GFR:  Lab Results  Component Value Date   GFRNONAA 101  08/01/2019   Patient is on Vitamin D supplement, increased from 4000 IU to 7000 IU, taking daily   Lab Results  Component Value Date   VD25OH 46 08/01/2019        Current Medications:  Current Outpatient Medications on File Prior to Visit  Medication Sig  . atorvastatin (LIPITOR) 80 MG tablet Take     1 tablet     Daily       for Cholesterol  . Cholecalciferol (VITAMIN D) 50 MCG (2000 UT) tablet Take 2 tablets (4,000 Units total) by  mouth daily.  Marland Kitchen dexamethasone (DECADRON) 4 MG tablet Take 1 tab 3 x day - 3 days, then 2 x day - 3 days, then 1 tab daily  . fluticasone (FLONASE) 50 MCG/ACT nasal spray Place 1 spray into both nostrils daily. (Patient taking differently: Place 1 spray into both nostrils as needed.)  . meloxicam (MOBIC) 15 MG tablet 1/2 TO 1 TABLET BY MOUTH DAILY AS NEEDED WITH FOOD FOR HIP PAIN. CAN TAKE WITH TYLENOL.  Marland Kitchen sertraline (ZOLOFT) 100 MG tablet TAKE 1 TABLET BY MOUTH DAILY FOR MOOD  . terbinafine (LAMISIL) 250 MG tablet Take one daily for 1 month followed by 1 month of no medication, repeat for 3 cycles. Need liver function labs routinely.   No current facility-administered medications on file prior to visit.     Allergies:  Allergies  Allergen Reactions  . Penicillins Hives    Childhood allergy     Medical History:  Past Medical History:  Diagnosis Date  . Morbid obesity with BMI of 45.0-49.9, adult (HCC) 09/27/2019   Family history- Reviewed and unchanged Social history- Reviewed and unchanged   Review of Systems:  Review of Systems  Constitutional: Negative for malaise/fatigue and weight loss.  HENT: Negative for hearing loss and tinnitus.   Eyes: Negative for blurred vision and double vision.  Respiratory: Negative for cough, shortness of breath and wheezing.   Cardiovascular: Negative for chest pain, palpitations, orthopnea, claudication and leg swelling.  Gastrointestinal: Negative for abdominal pain, blood in stool, constipation, diarrhea, heartburn, melena, nausea and vomiting.  Genitourinary: Negative.   Musculoskeletal: Negative for joint pain and myalgias.  Skin: Negative for rash.  Neurological: Negative for dizziness, tingling, sensory change, weakness and headaches.  Endo/Heme/Allergies: Negative for polydipsia.  Psychiatric/Behavioral: Negative.   All other systems reviewed and are negative.     Physical Exam: There were no vitals taken for this visit. Wt  Readings from Last 3 Encounters:  09/27/19 202 lb 3.2 oz (91.7 kg)  08/01/19 207 lb 6.4 oz (94.1 kg)  01/04/19 264 lb (119.7 kg)   General Appearance: Well nourished, in no apparent distress. Eyes: PERRLA, EOMs, conjunctiva no swelling or erythema Sinuses: No Frontal/maxillary tenderness ENT/Mouth: Ext aud canals clear, TMs without erythema, bulging. No erythema, swelling, or exudate on post pharynx.  Tonsils not swollen or erythematous. Hearing normal.  Neck: Supple, thyroid normal.  Respiratory: Respiratory effort normal, BS equal bilaterally without rales, rhonchi, wheezing or stridor.  Cardio: RRR with no MRGs. Brisk peripheral pulses without edema.  Abdomen: Soft, + BS.  Non tender, no guarding, rebound, hernias, masses. Lymphatics: Non tender without lymphadenopathy.  Musculoskeletal: Full ROM, 5/5 strength, Normal gait. L 1st toe nail with strip of yellowed area extending nearly to the base. *** Skin: Warm, dry without rashes, lesions, ecchymosis.  Neuro: Cranial nerves intact. No cerebellar symptoms.  Psych: Awake and oriented X 3, normal affect, Insight and Judgment appropriate.    Carlyon Shadow  Laurence Aly, NP 8:46 AM [facility]

## 2020-07-10 ENCOUNTER — Ambulatory Visit: Payer: BC Managed Care – PPO | Admitting: Adult Health

## 2020-07-10 DIAGNOSIS — E782 Mixed hyperlipidemia: Secondary | ICD-10-CM

## 2020-07-10 DIAGNOSIS — F419 Anxiety disorder, unspecified: Secondary | ICD-10-CM

## 2020-07-10 DIAGNOSIS — E663 Overweight: Secondary | ICD-10-CM

## 2020-07-10 DIAGNOSIS — B351 Tinea unguium: Secondary | ICD-10-CM

## 2020-07-10 DIAGNOSIS — E559 Vitamin D deficiency, unspecified: Secondary | ICD-10-CM

## 2020-07-10 DIAGNOSIS — I10 Essential (primary) hypertension: Secondary | ICD-10-CM

## 2020-07-10 DIAGNOSIS — R7309 Other abnormal glucose: Secondary | ICD-10-CM

## 2020-07-12 ENCOUNTER — Other Ambulatory Visit: Payer: Self-pay | Admitting: Adult Health

## 2020-07-12 MED ORDER — FLUTICASONE PROPIONATE 50 MCG/ACT NA SUSP: 1.0000 | Freq: Every day | NASAL | 2 refills | Status: AC | PRN

## 2020-08-03 ENCOUNTER — Other Ambulatory Visit: Payer: Self-pay | Admitting: Internal Medicine

## 2020-08-03 DIAGNOSIS — E785 Hyperlipidemia, unspecified: Secondary | ICD-10-CM

## 2020-08-07 ENCOUNTER — Ambulatory Visit: Payer: BC Managed Care – PPO | Admitting: Adult Health

## 2020-08-19 NOTE — Progress Notes (Signed)
FOLLOW UP  Assessment and Plan:   Cholesterol Currently near goal; continue statin  Lifestyle discussed, reduce animal fats Continue low cholesterol diet and exercise.  Check lipid panel.   Hx of prediabetes Recent A1Cs at goal Discussed diet/exercise, weight management  Defer A1C; check CMP  Obesity - BMI 30 with co morbidities - chol, OSA Commended excellent weight loss progress, down 100 lb in last year with keto Risks of keto discussed, reviewed healthier modifications Long discussion about weight loss, diet, and exercise Recommended diet heavy in fruits and veggies and low in animal meats, cheeses, and dairy products, appropriate calorie intake Discussed ideal weight for height (below <170 lb) and initial weight goal (<200 lb) Will follow up in 3 months  Vitamin D Def Below goal at last visit; increase to 7000 IU continue supplementation to maintain goal of 60-100 Defer Vit D level  Depression/anxiety Continue medications  Lifestyle discussed: diet/exerise, sleep hygiene, stress management, hydration  Continue diet and meds as discussed. Further disposition pending results of labs. Discussed med's effects and SE's.   Over 30 minutes of exam, counseling, chart review, and critical decision making was performed.   Future Appointments  Date Time Provider Department Center  01/29/2021  3:00 PM [name], NP [facility] None    ----------------------------------------------------------------------------------------------------------------------  HPI 50 y.o. male  presents for 6 month follow up on cholesterol,  Weight, mood, and vitamin D deficiency.   He is on zoloft 100 mg daily for mood/OCD and feels he is doing well on this dose.   He has OSA on CPAP, endorses 100% compliance with restorative sleep.   BMI is Body mass index is 31.9 kg/m., he has been working on diet and exercise, pushing water, watching portions, doing a modified keto hard start since June  2020 with his wife with excellent results, weight down from 315 lb 09/2017 No carb, no sugars, veggies and lean protein He is aiming to get to 200 lb or so.  He is walking a lot at work 10000-12000 steps daily, 4-5 hours in a paddle boat weekly when warm Drinks 6-7 bottles of water daily  Caffeine: 1 caffeinated drink daily  Sleep: good, 6-7 hours  Wt Readings from Last 3 Encounters:  08/20/20 216 lb (98 kg)  09/27/19 202 lb 3.2 oz (91.7 kg)  08/01/19 207 lb 6.4 oz (94.1 kg)   Today their BP is BP: 126/82  He does workout. He denies chest pain, shortness of breath, dizziness.   He is on cholesterol medication Atorvastatin 80 mg daily and denies myalgias. His cholesterol is at goal. The cholesterol last visit was:   Lab Results  Component Value Date   CHOL 170 08/01/2019   HDL 44 08/01/2019   LDLCALC 108 (H) 08/01/2019   TRIG 85 08/01/2019   CHOLHDL 3.9 08/01/2019    He has been working on diet and exercise for glucose management (hx of prediabetes, a1c 5.7% IN 2017), and denies nausea, paresthesia of the feet, polydipsia, polyuria, visual disturbances and vomiting. Last A1C in the office was:  Lab Results  Component Value Date   HGBA1C 5.1 01/04/2019    Last GFR:  Lab Results  Component Value Date   GFRNONAA 101 08/01/2019   Patient is on Vitamin D supplement, 6000 IU, taking daily   Lab Results  Component Value Date   VD25OH 46 08/01/2019        Current Medications:  Current Outpatient Medications on File Prior to Visit  Medication Sig  . atorvastatin (LIPITOR)  80 MG tablet TAKE 1 TABLET BY MOUTH EVERY DAY FOR CHOLESTEROL  . Cholecalciferol (VITAMIN D) 50 MCG (2000 UT) tablet Take 2 tablets (4,000 Units total) by mouth daily.  . fluticasone (FLONASE) 50 MCG/ACT nasal spray Place 1 spray into both nostrils daily as needed.  . sertraline (ZOLOFT) 100 MG tablet TAKE 1 TABLET BY MOUTH DAILY FOR MOOD  . dexamethasone (DECADRON) 4 MG tablet Take 1 tab 3 x day - 3 days, then  2 x day - 3 days, then 1 tab daily  . meloxicam (MOBIC) 15 MG tablet 1/2 TO 1 TABLET BY MOUTH DAILY AS NEEDED WITH FOOD FOR HIP PAIN. CAN TAKE WITH TYLENOL. (Patient not taking: Reported on 08/20/2020)  . terbinafine (LAMISIL) 250 MG tablet Take one daily for 1 month followed by 1 month of no medication, repeat for 3 cycles. Need liver function labs routinely. (Patient not taking: Reported on 08/20/2020)   No current facility-administered medications on file prior to visit.     Allergies:  Allergies  Allergen Reactions  . Penicillins Hives    Childhood allergy     Medical History:  Past Medical History:  Diagnosis Date  . Morbid obesity with BMI of 45.0-49.9, adult (HCC) 09/27/2019   Family history- Reviewed and unchanged Social history- Reviewed and unchanged   Review of Systems:  Review of Systems  Constitutional: Negative for malaise/fatigue and weight loss.  HENT: Negative for hearing loss and tinnitus.   Eyes: Negative for blurred vision and double vision.  Respiratory: Negative for cough, shortness of breath and wheezing.   Cardiovascular: Negative for chest pain, palpitations, orthopnea, claudication and leg swelling.  Gastrointestinal: Negative for abdominal pain, blood in stool, constipation, diarrhea, heartburn, melena, nausea and vomiting.  Genitourinary: Negative.   Musculoskeletal: Negative for joint pain and myalgias.  Skin: Negative for rash.  Neurological: Negative for dizziness, tingling, sensory change, weakness and headaches.  Endo/Heme/Allergies: Negative for polydipsia.  Psychiatric/Behavioral: Negative.   All other systems reviewed and are negative.     Physical Exam: BP 126/82   Pulse 64   Temp 97.9 F (36.6 C)   Wt 216 lb (98 kg)   SpO2 97%   BMI 31.90 kg/m  Wt Readings from Last 3 Encounters:  08/20/20 216 lb (98 kg)  09/27/19 202 lb 3.2 oz (91.7 kg)  08/01/19 207 lb 6.4 oz (94.1 kg)   General Appearance: Well nourished, in no apparent  distress. Eyes: PERRLA, EOMs, conjunctiva no swelling or erythema Sinuses: No Frontal/maxillary tenderness ENT/Mouth: Ext aud canals clear, TMs without erythema, bulging. No erythema, swelling, or exudate on post pharynx.  Tonsils not swollen or erythematous. Hearing normal.  Neck: Supple, thyroid normal.  Respiratory: Respiratory effort normal, BS equal bilaterally without rales, rhonchi, wheezing or stridor.  Cardio: RRR with no MRGs. Brisk peripheral pulses without edema.  Abdomen: Soft, + BS.  Non tender, no guarding, rebound, hernias, masses. Lymphatics: Non tender without lymphadenopathy.  Musculoskeletal: Full ROM, 5/5 strength, Normal gait.  Skin: Warm, dry without rashes, lesions, ecchymosis.  Neuro: Cranial nerves intact. No cerebellar symptoms.  Psych: Awake and oriented X 3, normal affect, Insight and Judgment appropriate.    Dan Maker, NP 1:54 PM [facility]

## 2020-08-20 ENCOUNTER — Encounter: Payer: Self-pay | Admitting: Adult Health

## 2020-08-20 ENCOUNTER — Ambulatory Visit: Payer: BC Managed Care – PPO | Admitting: Adult Health

## 2020-08-20 ENCOUNTER — Other Ambulatory Visit: Payer: Self-pay

## 2020-08-20 VITALS — BP 126/82 | HR 64 | Temp 97.9°F | Wt 216.0 lb

## 2020-08-20 DIAGNOSIS — G4733 Obstructive sleep apnea (adult) (pediatric): Secondary | ICD-10-CM | POA: Diagnosis not present

## 2020-08-20 DIAGNOSIS — E559 Vitamin D deficiency, unspecified: Secondary | ICD-10-CM

## 2020-08-20 DIAGNOSIS — I10 Essential (primary) hypertension: Secondary | ICD-10-CM

## 2020-08-20 DIAGNOSIS — E782 Mixed hyperlipidemia: Secondary | ICD-10-CM | POA: Diagnosis not present

## 2020-08-20 DIAGNOSIS — Z79899 Other long term (current) drug therapy: Secondary | ICD-10-CM | POA: Diagnosis not present

## 2020-08-20 DIAGNOSIS — F419 Anxiety disorder, unspecified: Secondary | ICD-10-CM

## 2020-08-20 DIAGNOSIS — E663 Overweight: Secondary | ICD-10-CM | POA: Diagnosis not present

## 2020-08-20 DIAGNOSIS — R7309 Other abnormal glucose: Secondary | ICD-10-CM

## 2020-08-20 MED ORDER — VITAMIN D 50 MCG (2000 UT) PO TABS: 6000.0000 [IU] | ORAL_TABLET | Freq: Every day | ORAL | Status: DC

## 2020-08-20 NOTE — Patient Instructions (Addendum)
Goals    .  Weight (lb) < 200 lb (90.7 kg) (pt-stated)       Try to reduce red meat, animal fats -   Current guidelines recommend limit red meat to <6 oz/week  Animal fats high in saturated fat - assocaited with heart attacks/strokes  Better sources are avocado, all nuts/seeds (chia, ground flax are particularly good for cholesterol), olives/olive oil, avocado oil for high heat cooking  Beans are excellent for protein, fiber, does have starch but very even energy with minimal insulin spike - associated with low chronic disease and longevity  Work up to 4+ servings/week    High-Fiber Eating Plan Fiber, also called dietary fiber, is a type of carbohydrate. It is found foods such as fruits, vegetables, whole grains, and beans. A high-fiber diet can have many health benefits. Your health care provider may recommend a high-fiber diet to help:  Prevent constipation. Fiber can make your bowel movements more regular.  Lower your cholesterol.  Relieve the following conditions: ? Inflammation of veins in the anus (hemorrhoids). ? Inflammation of specific areas of the digestive tract (uncomplicated diverticulosis). ? A problem of the large intestine, also called the colon, that sometimes causes pain and diarrhea (irritable bowel syndrome, or IBS).  Prevent overeating as part of a weight-loss plan.  Prevent heart disease, type 2 diabetes, and certain cancers. What are tips for following this plan? Reading food labels  Check the nutrition facts label on food products for the amount of dietary fiber. Choose foods that have 5 grams of fiber or more per serving.  The goals for recommended daily fiber intake include: ? Men (age 54 or younger): 34-38 g. ? Men (over age 64): 28-34 g. ? Women (age 15 or younger): 25-28 g. ? Women (over age 76): 22-25 g. Your daily fiber goal is _____________ g.   Shopping  Choose whole fruits and vegetables instead of processed forms, such as apple juice or  applesauce.  Choose a wide variety of high-fiber foods such as avocados, lentils, oats, and kidney beans.  Read the nutrition facts label of the foods you choose. Be aware of foods with added fiber. These foods often have high sugar and sodium amounts per serving. Cooking  Use whole-grain flour for baking and cooking.  Cook with brown rice instead of white rice. Meal planning  Start the day with a breakfast that is high in fiber, such as a cereal that contains 5 g of fiber or more per serving.  Eat breads and cereals that are made with whole-grain flour instead of refined flour or white flour.  Eat brown rice, bulgur wheat, or millet instead of white rice.  Use beans in place of meat in soups, salads, and pasta dishes.  Be sure that half of the grains you eat each day are whole grains. General information  You can get the recommended daily intake of dietary fiber by: ? Eating a variety of fruits, vegetables, grains, nuts, and beans. ? Taking a fiber supplement if you are not able to take in enough fiber in your diet. It is better to get fiber through food than from a supplement.  Gradually increase how much fiber you consume. If you increase your intake of dietary fiber too quickly, you may have bloating, cramping, or gas.  Drink plenty of water to help you digest fiber.  Choose high-fiber snacks, such as berries, raw vegetables, nuts, and popcorn. What foods should I eat? Fruits Berries. Pears. Apples. Oranges. Avocado. Prunes and  raisins. Dried figs. Vegetables Sweet potatoes. Spinach. Kale. Artichokes. Cabbage. Broccoli. Cauliflower. Green peas. Carrots. Squash. Grains Whole-grain breads. Multigrain cereal. Oats and oatmeal. Brown rice. Barley. Bulgur wheat. Millet. Quinoa. Bran muffins. Popcorn. Rye wafer crackers. Meats and other proteins Navy beans, kidney beans, and pinto beans. Soybeans. Split peas. Lentils. Nuts and seeds. Dairy Fiber-fortified  yogurt. Beverages Fiber-fortified soy milk. Fiber-fortified orange juice. Other foods Fiber bars. The items listed above may not be a complete list of recommended foods and beverages. Contact a dietitian for more information. What foods should I avoid? Fruits Fruit juice. Cooked, strained fruit. Vegetables Fried potatoes. Canned vegetables. Well-cooked vegetables. Grains White bread. Pasta made with refined flour. White rice. Meats and other proteins Fatty cuts of meat. Fried chicken or fried fish. Dairy Milk. Yogurt. Cream cheese. Sour cream. Fats and oils Butters. Beverages Soft drinks. Other foods Cakes and pastries. The items listed above may not be a complete list of foods and beverages to avoid. Talk with your dietitian about what choices are best for you. Summary  Fiber is a type of carbohydrate. It is found in foods such as fruits, vegetables, whole grains, and beans.  A high-fiber diet has many benefits. It can help to prevent constipation, lower blood cholesterol, aid weight loss, and reduce your risk of heart disease, diabetes, and certain cancers.  Increase your intake of fiber gradually. Increasing fiber too quickly may cause cramping, bloating, and gas. Drink plenty of water while you increase the amount of fiber you consume.  The best sources of fiber include whole fruits and vegetables, whole grains, nuts, seeds, and beans. This information is not intended to replace advice given to you by your health care provider. Make sure you discuss any questions you have with your health care provider. Document Revised: 08/31/2019 Document Reviewed: 08/31/2019 Elsevier Patient Education  2021 [employer].

## 2020-08-21 LAB — COMPLETE METABOLIC PANEL WITH GFR
AG Ratio: 2 (calc) (ref 1.0–2.5)
ALT: 44 U/L (ref 9–46)
AST: 30 U/L (ref 10–40)
Albumin: 4.3 g/dL (ref 3.6–5.1)
Alkaline phosphatase (APISO): 44 U/L (ref 36–130)
BUN: 22 mg/dL (ref 7–25)
CO2: 27 mmol/L (ref 20–32)
Calcium: 9.8 mg/dL (ref 8.6–10.3)
Chloride: 106 mmol/L (ref 98–110)
Creat: 0.83 mg/dL (ref 0.60–1.35)
GFR, Est African American: 121 mL/min/{1.73_m2} (ref >=60)
GFR, Est Non African American: 105 mL/min/{1.73_m2} (ref >=60)
Globulin: 2.1 g/dL (calc) (ref 1.9–3.7)
Glucose, Bld: 80 mg/dL (ref 65–99)
Potassium: 4.3 mmol/L (ref 3.5–5.3)
Sodium: 142 mmol/L (ref 135–146)
Total Bilirubin: 0.7 mg/dL (ref 0.2–1.2)
Total Protein: 6.4 g/dL (ref 6.1–8.1)

## 2020-08-21 LAB — VITAMIN D 25 HYDROXY (VIT D DEFICIENCY, FRACTURES): Vit D, 25-Hydroxy: 58 ng/mL (ref 30–100)

## 2020-08-21 LAB — CBC WITH DIFFERENTIAL/PLATELET
Absolute Monocytes: 389 cells/uL (ref 200–950)
Basophils Absolute: 59 cells/uL (ref 0–200)
Basophils Relative: 0.9 %
Eosinophils Absolute: 172 cells/uL (ref 15–500)
Eosinophils Relative: 2.6 %
HCT: 43.6 % (ref 38.5–50.0)
Hemoglobin: 14.3 g/dL (ref 13.2–17.1)
Lymphs Abs: 1881 cells/uL (ref 850–3900)
MCH: 30.3 pg (ref 27.0–33.0)
MCHC: 32.8 g/dL (ref 32.0–36.0)
MCV: 92.4 fL (ref 80.0–100.0)
MPV: 10.2 fL (ref 7.5–12.5)
Monocytes Relative: 5.9 %
Neutro Abs: 4099 cells/uL (ref 1500–7800)
Neutrophils Relative %: 62.1 %
Platelets: 179 10*3/uL (ref 140–400)
RBC: 4.72 10*6/uL (ref 4.20–5.80)
RDW: 12.8 % (ref 11.0–15.0)
Total Lymphocyte: 28.5 %
WBC: 6.6 10*3/uL (ref 3.8–10.8)

## 2020-08-21 LAB — LIPID PANEL
Cholesterol: 129 mg/dL (ref <200)
HDL: 52 mg/dL (ref >=40)
LDL Cholesterol (Calc): 63 mg/dL (calc)
Non-HDL Cholesterol (Calc): 77 mg/dL (calc) (ref <130)
Total CHOL/HDL Ratio: 2.5 (calc) (ref <5.0)
Triglycerides: 59 mg/dL (ref <150)

## 2020-08-21 LAB — MAGNESIUM: Magnesium: 2 mg/dL (ref 1.5–2.5)

## 2020-08-21 LAB — TSH: TSH: 1.37 mIU/L (ref 0.40–4.50)

## 2020-09-28 ENCOUNTER — Other Ambulatory Visit: Payer: Self-pay | Admitting: Internal Medicine

## 2020-09-28 DIAGNOSIS — F32A Depression, unspecified: Secondary | ICD-10-CM

## 2020-12-27 NOTE — Progress Notes (Deleted)
Assessment and Plan:  There are no diagnoses linked to this encounter.    Further disposition pending results of labs. Discussed med's effects and SE's.   Over 30 minutes of exam, counseling, chart review, and critical decision making was performed.   Future Appointments  Date Time Provider Department Center  12/30/2020 11:30 AM Revonda Humphrey, NP [facility] None  01/29/2021  3:00 PM [name], NP [facility] None    ------------------------------------------------------------------------------------------------------------------   HPI There were no vitals taken for this visit. 49 y.o.male presents for  Past Medical History:  Diagnosis Date   Morbid obesity with BMI of 45.0-49.9, adult (HCC) 09/27/2019     Allergies  Allergen Reactions   Penicillins Hives    Childhood allergy    Current Outpatient Medications on File Prior to Visit  Medication Sig   atorvastatin (LIPITOR) 80 MG tablet TAKE 1 TABLET BY MOUTH EVERY DAY FOR CHOLESTEROL   Cholecalciferol (VITAMIN D) 50 MCG (2000 UT) tablet Take 3 tablets (6,000 Units total) by mouth daily.   fluticasone (FLONASE) 50 MCG/ACT nasal spray Place 1 spray into both nostrils daily as needed.   sertraline (ZOLOFT) 100 MG tablet Take 1 tablet Daily for Mood   No current facility-administered medications on file prior to visit.    ROS: all negative except above.   Physical Exam:  There were no vitals taken for this visit.  General Appearance: Well nourished, in no apparent distress. Eyes: PERRLA, EOMs, conjunctiva no swelling or erythema Sinuses: No Frontal/maxillary tenderness ENT/Mouth: Ext aud canals clear, TMs without erythema, bulging. No erythema, swelling, or exudate on post pharynx.  Tonsils not swollen or erythematous. Hearing normal.  Neck: Supple, thyroid normal.  Respiratory: Respiratory effort normal, BS equal bilaterally without rales, rhonchi, wheezing or stridor.  Cardio: RRR with no MRGs. Brisk peripheral  pulses without edema.  Abdomen: Soft, + BS.  Non tender, no guarding, rebound, hernias, masses. Lymphatics: Non tender without lymphadenopathy.  Musculoskeletal: Full ROM, 5/5 strength, normal gait.  Skin: Warm, dry without rashes, lesions, ecchymosis.  Neuro: Cranial nerves intact. Normal muscle tone, no cerebellar symptoms. Sensation intact.  Psych: Awake and oriented X 3, normal affect, Insight and Judgment appropriate.     Revonda Humphrey, NP 12:22 PM [facility]

## 2020-12-30 ENCOUNTER — Ambulatory Visit: Payer: BC Managed Care – PPO | Admitting: Nurse Practitioner

## 2021-01-06 ENCOUNTER — Encounter: Payer: Self-pay | Admitting: Physician Assistant

## 2021-01-29 ENCOUNTER — Encounter: Payer: Self-pay | Admitting: Adult Health

## 2021-02-25 NOTE — Progress Notes (Signed)
Complete Physical  Assessment and Plan:  Mike Kramer was seen today for annual exam.  Diagnoses and all orders for this visit:  Encounter for Annual Physical Exam with abnormal findings Due annually  Health Maintenance reviewed Healthy lifestyle reviewed and goals set Declines flu vaccine Referral placed for colonoscopy after discussion of risks and benefits  Essential hypertension Controlled off of medication/by lifestyle Monitor blood pressure at home; call if consistently over 130/80 Continue DASH diet.   Reminder to go to the ER if any CP, SOB, nausea, dizziness, severe HA, changes vision/speech, left arm numbness and tingling and jaw pain. -     COMPLETE METABOLIC PANEL WITH GFR -     TSH -     Urinalysis w microscopic + reflex cultur -     Microalbumin / creatinine urine ratio -     EKG 12-Lead - defer  OSA (obstructive sleep apnea) Wears CPAP, 100%, working well   Vitamin D deficiency At goal recent check continue to recommend supplementation for goal of 60-100 Defer vitamin D level  Other abnormal glucose Discussed disease and risks Discussed diet/exercise, weight management  -     Hemoglobin A1c - defer, weight down, montior serum glucose (CMP)  Obesity - BMI 31 Excellent progress with modified keto Long discussion about weight loss, diet, and exercise Recommended diet heavy in fruits and veggies and low in animal meats, cheeses, and dairy products, appropriate calorie intake for long term health Discussed appropriate weight for height - current end goal is 200 lb Work to increase unprocessed plants; high fiber diet reviewed; low carb options reviewed with long term fiber goal given with handout Follow up at next visit in 6 months  Mixed hyperlipidemia Continue medications: atorvastatin 80 mg daily  Continue low cholesterol diet and exercise.  Check lipid panel.  -     Lipid panel -     TSH  Anxiety Well managed by current regimen; continue medications Stress  management techniques discussed, increase water, good sleep hygiene discussed, increase exercise, and increase veggies.  -     TSH  Orders Placed This Encounter  Procedures   CBC with Differential/Platelet   COMPLETE METABOLIC PANEL WITH GFR   Magnesium   Lipid panel   TSH   Microalbumin / creatinine urine ratio   Urinalysis, Routine w reflex microscopic   Ambulatory referral to Gastroenterology    Discussed med's effects and SE's. Screening labs and tests as requested with regular follow-up as recommended.  Future Appointments  Date Time Provider Department Center  02/26/2022  3:00 PM [name], NP [facility] None     HPI Patient presents for a complete physical. He has Labile HTN; Overweight (BMI 25.0-29.9); Hyperlipidemia; Vitamin D deficiency; OSA (obstructive sleep apnea); Other abnormal glucose (hx of prediabetes); Anxiety; and Right hip pain on their problem list.   He is a Mike Kramer, married with 2 kids, 19 and 18.    He is on zoloft 100 mg daily for mood/OCD and feels he is doing well on this dose.   He has OSA on CPAP, endorses 100% compliance with restorative sleep,.   BMI is Body mass index is 31.9 kg/m., he has been working on diet and exercise, pushing water, watching portions, doing a modified keto intermittently since June 2020 with his wife, reports recently restarted and down 9 lb. He is aiming to get to 200 lb or so.  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  Drinks 6-7 bottles of water daily  Caffeine: 1 caffeinated drink daily  Sleep: good, 6-7 hours  Fruits/veggies:1 Wt Readings from Last 3 Encounters:  02/26/21 216 lb (98 kg)  08/20/20 216 lb (98 kg)  09/27/19 202 lb 3.2 oz (91.7 kg)   His blood pressure has been controlled at home, today their BP is BP: 118/72 He does workout. He denies chest pain, shortness of breath, dizziness.   He is on cholesterol medication (atorvastatin 80 mg  daily) and denies myalgias. His LDL cholesterol is at goal. The cholesterol last visit was:   Lab Results  Component Value Date   CHOL 129 08/20/2020   HDL 52 08/20/2020   LDLCALC 63 08/20/2020   TRIG 59 08/20/2020   CHOLHDL 2.5 08/20/2020   He is working on losing weight by keto type diet. Last A1C in the office was:  Lab Results  Component Value Date   HGBA1C 5.1 01/04/2019   Patient is on Vitamin D supplement Lab Results  Component Value Date   VD25OH 58 08/20/2020    Current Medications:  Current Outpatient Medications on File Prior to Visit  Medication Sig Dispense Refill   atorvastatin (LIPITOR) 80 MG tablet TAKE 1 TABLET BY MOUTH EVERY DAY FOR CHOLESTEROL 90 tablet 3   Cholecalciferol (VITAMIN D) 50 MCG (2000 UT) tablet Take 3 tablets (6,000 Units total) by mouth daily.     fluticasone (FLONASE) 50 MCG/ACT nasal spray Place 1 spray into both nostrils daily as needed. 16 g 2   sertraline (ZOLOFT) 100 MG tablet Take 1 tablet Daily for Mood 90 tablet 3   No current facility-administered medications on file prior to visit.    Health Maintenance:  Immunization History  Administered Date(s) Administered   Influenza,inj,quad, With Preservative 02/20/2016   PFIZER(Purple Top)SARS-COV-2 Vaccination 08/01/2019, 08/24/2019   Tetanus 02/20/2016    Tetanus: 02/20/16 Flu vaccine: 2017 Covid 19: 2/2, pfizer  Colonoscopy:   Eye Exam:  [place], ? Dr. , last visit 2020, wears glasses, goes annually, has planned in 2023 Dentist:  Dr. Elroy Channel, Twice yearly visits, 2022, goes q37m  Patient Care Team: Lucky Cowboy, MD as PCP - General (Internal Medicine)  Allergies:  Allergies  Allergen Reactions   Penicillins Hives    Childhood allergy    Medical History:  Past Medical History:  Diagnosis Date   Morbid obesity with BMI of 45.0-49.9, adult (HCC) 09/27/2019    Surgical History:  Past Surgical History:  Procedure Laterality Date   KNEE SURGERY Right 1991    arthroscopic, "cleaning out"    ORIF FINGER FRACTURE Right 2017   right pinky   TONSILLECTOMY Bilateral 1983   VASECTOMY  2014   ? Dr. Vernie Ammons    Family History:  Family History  Problem Relation Age of Onset   Skin cancer Father 17       ? melanoma   Cancer - Other Paternal Grandfather        gallbladder    Social History:   Social History   Tobacco Use   Smoking status: Never   Smokeless tobacco: Never  Vaping Use   Vaping Use: Never used  Substance Use Topics   Alcohol use: Yes    Alcohol/week: 0.0 standard drinks    Comment: occasional   Drug use: No    Review of Systems:  Review of Systems  Constitutional:  Negative for malaise/fatigue and weight loss.  HENT:  Negative for hearing loss and tinnitus.   Eyes:  Negative  for blurred vision and double vision.  Respiratory:  Negative for cough, sputum production, shortness of breath and wheezing.   Cardiovascular:  Negative for chest pain, palpitations, orthopnea, claudication, leg swelling and PND.  Gastrointestinal:  Negative for abdominal pain, blood in stool, constipation, diarrhea, heartburn, melena, nausea and vomiting.  Genitourinary: Negative.   Musculoskeletal:  Negative for joint pain and myalgias.  Skin:  Negative for rash.  Neurological:  Negative for dizziness, tingling, sensory change, weakness and headaches.  Endo/Heme/Allergies:  Negative for polydipsia.  Psychiatric/Behavioral: Negative.  Negative for depression, memory loss, substance abuse and suicidal ideas. The patient is not nervous/anxious and does not have insomnia.   All other systems reviewed and are negative.  Physical Exam: Estimated body mass index is 31.9 kg/m as calculated from the following:   Height as of this encounter: 5\' 9"  (1.753 m).   Weight as of this encounter: 216 lb (98 kg). BP 118/72   Pulse (!) 53   Temp 97.7 F (36.5 C)   Ht 5\' 9"  (1.753 m)   Wt 216 lb (98 kg)   SpO2 99%   BMI 31.90 kg/m   General Appearance:  Well nourished, in no apparent distress.  Eyes: PERRLA, EOMs, conjunctiva no swelling or erythema ENT/Mouth: Ear canals clear bilaterally with no erythema, swelling, discharge.  TMs normal bilaterally with no erythema, bulging, or retractions.  Oropharynx clear and moist with no exudate, swelling, or erythema.  Dentition normal.   Neck: Supple, thyroid normal. No bruits, JVD, cervical adenopathy Respiratory: Respiratory effort normal, BS equal bilaterally without rales, rhonchi, wheezing or stridor.  Cardio: RRR without murmurs, rubs or gallops. Brisk peripheral pulses without edema.  Chest: symmetric, with normal excursions Abdomen: Soft, nontender, no guarding, rebound, hernias, masses, or organomegaly. Genitourinary: Defer, no concerns Musculoskeletal: Full ROM all peripheral extremities,5/5 strength, and normal gait.  Skin: Warm, dry without rashes, lesions, ecchymosis. Benign appearing moles and cherry angiomas.  Neuro: A&Ox3, Cranial nerves intact, reflexes equal bilaterally. Normal muscle tone, no cerebellar symptoms. Sensation intact.  Psych: Normal affect, Insight and Judgment appropriate.   EKG: Sinus brady, NSCPT in 2020, defer  AORTA SCAN: defer  Over 40 minutes of exam, counseling, chart review and critical decision making was performed  3:51 PM [facility]

## 2021-02-26 ENCOUNTER — Encounter: Payer: Self-pay | Admitting: Adult Health

## 2021-02-26 ENCOUNTER — Other Ambulatory Visit: Payer: Self-pay

## 2021-02-26 ENCOUNTER — Ambulatory Visit (INDEPENDENT_AMBULATORY_CARE_PROVIDER_SITE_OTHER): Payer: BC Managed Care – PPO | Admitting: Adult Health

## 2021-02-26 VITALS — BP 118/72 | HR 53 | Temp 97.7°F | Ht 69.0 in | Wt 216.0 lb

## 2021-02-26 DIAGNOSIS — Z0001 Encounter for general adult medical examination with abnormal findings: Secondary | ICD-10-CM

## 2021-02-26 DIAGNOSIS — Z1389 Encounter for screening for other disorder: Secondary | ICD-10-CM

## 2021-02-26 DIAGNOSIS — F419 Anxiety disorder, unspecified: Secondary | ICD-10-CM

## 2021-02-26 DIAGNOSIS — R7309 Other abnormal glucose: Secondary | ICD-10-CM

## 2021-02-26 DIAGNOSIS — Z1211 Encounter for screening for malignant neoplasm of colon: Secondary | ICD-10-CM

## 2021-02-26 DIAGNOSIS — Z79899 Other long term (current) drug therapy: Secondary | ICD-10-CM | POA: Diagnosis not present

## 2021-02-26 DIAGNOSIS — Z0000 Encounter for general adult medical examination without abnormal findings: Secondary | ICD-10-CM | POA: Diagnosis not present

## 2021-02-26 DIAGNOSIS — E559 Vitamin D deficiency, unspecified: Secondary | ICD-10-CM

## 2021-02-26 DIAGNOSIS — G4733 Obstructive sleep apnea (adult) (pediatric): Secondary | ICD-10-CM

## 2021-02-26 DIAGNOSIS — Z131 Encounter for screening for diabetes mellitus: Secondary | ICD-10-CM

## 2021-02-26 DIAGNOSIS — Z1329 Encounter for screening for other suspected endocrine disorder: Secondary | ICD-10-CM

## 2021-02-26 DIAGNOSIS — E663 Overweight: Secondary | ICD-10-CM

## 2021-02-26 DIAGNOSIS — E782 Mixed hyperlipidemia: Secondary | ICD-10-CM

## 2021-02-26 DIAGNOSIS — Z13220 Encounter for screening for lipoid disorders: Secondary | ICD-10-CM | POA: Diagnosis not present

## 2021-02-26 DIAGNOSIS — I10 Essential (primary) hypertension: Secondary | ICD-10-CM

## 2021-02-26 DIAGNOSIS — Z136 Encounter for screening for cardiovascular disorders: Secondary | ICD-10-CM

## 2021-02-26 NOTE — Patient Instructions (Addendum)
Mike Kramer , Thank you for taking time to come for your Annual Wellness Visit. I appreciate your ongoing commitment to your health goals. Please review the following plan we discussed and let me know if I can assist you in the future.   These are the goals we discussed:  Goals       DIET - EAT MORE FRUITS AND VEGETABLES      Aim for 5+ servings of 1/2 cup each daily  Beans and nuts/seeds Choose whole grains       Weight (lb) < 200 lb (90.7 kg) (pt-stated)        This is a list of the screening recommended for you and due dates:  Health Maintenance  Topic Date Due   Colon Cancer Screening  Never done   COVID-19 Vaccine (3 - Pfizer risk series) 03/14/2021*   Flu Shot  08/08/2021*   Pneumococcal Vaccination (1 - PCV) 02/27/2031*   Tetanus Vaccine  02/19/2026   HPV Vaccine  Aged Out   Hepatitis C Screening: USPSTF Recommendation to screen - Ages 18-79 yo.  Discontinued   HIV Screening  Discontinued  *Topic was postponed. The date shown is not the original due date.    Keto friendly high fiber - greens, cruciferous veggies, berries, nuts/seeds (chia, ground flax seed)   Know what a healthy weight is for you (roughly BMI <25) and aim to maintain this  Aim for 5+ servings of fruits and vegetables daily  65-80+ fluid ounces of water or unsweet tea for healthy kidneys  Limit to max 1 drink of alcohol per day; avoid smoking/tobacco  Limit animal fats in diet for cholesterol and heart health - choose grass fed whenever available  Avoid highly processed foods, and foods high in saturated/trans fats  Aim for low stress - take time to unwind and care for your mental health  Aim for 150 min of moderate intensity exercise weekly for heart health, and weights twice weekly for bone health  Aim for 7-9 hours of sleep daily    A great goal to work towards is aiming to get in a serving daily of some of the most nutritionally dense foods - G- BOMBS daily        High-Fiber  Eating Plan Fiber, also called dietary fiber, is a type of carbohydrate. It is found foods such as fruits, vegetables, whole grains, and beans. A high-fiber diet can have many health benefits. Your health care provider may recommend a high-fiber diet to help: Prevent constipation. Fiber can make your bowel movements more regular. Lower your cholesterol. Relieve the following conditions: Inflammation of veins in the anus (hemorrhoids). Inflammation of specific areas of the digestive tract (uncomplicated diverticulosis). A problem of the large intestine, also called the colon, that sometimes causes pain and diarrhea (irritable bowel syndrome, or IBS). Prevent overeating as part of a weight-loss plan. Prevent heart disease, type 2 diabetes, and certain cancers. What are tips for following this plan? Reading food labels  Check the nutrition facts label on food products for the amount of dietary fiber. Choose foods that have 5 grams of fiber or more per serving. The goals for recommended daily fiber intake include: Men (age 34 or younger): 34-38 g. Men (over age 13): 28-34 g. Women (age 20 or younger): 25-28 g. Women (over age 35): 22-25 g. Your daily fiber goal is _____________ g. Shopping Choose whole fruits and vegetables instead of processed forms, such as apple juice or applesauce. Choose a wide variety of  high-fiber foods such as avocados, lentils, oats, and kidney beans. Read the nutrition facts label of the foods you choose. Be aware of foods with added fiber. These foods often have high sugar and sodium amounts per serving. Cooking Use whole-grain flour for baking and cooking. Cook with brown rice instead of white rice. Meal planning Start the day with a breakfast that is high in fiber, such as a cereal that contains 5 g of fiber or more per serving. Eat breads and cereals that are made with whole-grain flour instead of refined flour or white flour. Eat brown rice, bulgur wheat, or  millet instead of white rice. Use beans in place of meat in soups, salads, and pasta dishes. Be sure that half of the grains you eat each day are whole grains. General information You can get the recommended daily intake of dietary fiber by: Eating a variety of fruits, vegetables, grains, nuts, and beans. Taking a fiber supplement if you are not able to take in enough fiber in your diet. It is better to get fiber through food than from a supplement. Gradually increase how much fiber you consume. If you increase your intake of dietary fiber too quickly, you may have bloating, cramping, or gas. Drink plenty of water to help you digest fiber. Choose high-fiber snacks, such as berries, raw vegetables, nuts, and popcorn. What foods should I eat? Fruits Berries. Pears. Apples. Oranges. Avocado. Prunes and raisins. Dried figs. Vegetables Sweet potatoes. Spinach. Kale. Artichokes. Cabbage. Broccoli. Cauliflower. Green peas. Carrots. Squash. Grains Whole-grain breads. Multigrain cereal. Oats and oatmeal. Brown rice. Barley. Bulgur wheat. Millet. Quinoa. Bran muffins. Popcorn. Rye wafer crackers. Meats and other proteins Navy beans, kidney beans, and pinto beans. Soybeans. Split peas. Lentils. Nuts and seeds. Dairy Fiber-fortified yogurt. Beverages Fiber-fortified soy milk. Fiber-fortified orange juice. Other foods Fiber bars. The items listed above may not be a complete list of recommended foods and beverages. Contact a dietitian for more information. What foods should I avoid? Fruits Fruit juice. Cooked, strained fruit. Vegetables Fried potatoes. Canned vegetables. Well-cooked vegetables. Grains White bread. Pasta made with refined flour. White rice. Meats and other proteins Fatty cuts of meat. Fried chicken or fried fish. Dairy Milk. Yogurt. Cream cheese. Sour cream. Fats and oils Butters. Beverages Soft drinks. Other foods Cakes and pastries. The items listed above may not be a  complete list of foods and beverages to avoid. Talk with your dietitian about what choices are best for you. Summary Fiber is a type of carbohydrate. It is found in foods such as fruits, vegetables, whole grains, and beans. A high-fiber diet has many benefits. It can help to prevent constipation, lower blood cholesterol, aid weight loss, and reduce your risk of heart disease, diabetes, and certain cancers. Increase your intake of fiber gradually. Increasing fiber too quickly may cause cramping, bloating, and gas. Drink plenty of water while you increase the amount of fiber you consume. The best sources of fiber include whole fruits and vegetables, whole grains, nuts, seeds, and beans. This information is not intended to replace advice given to you by your health care provider. Make sure you discuss any questions you have with your health care provider. Document Revised: 08/31/2019 Document Reviewed: 08/31/2019 Elsevier Patient Education  2022 [employer].

## 2021-02-27 LAB — URINALYSIS, ROUTINE W REFLEX MICROSCOPIC
Bilirubin Urine: NEGATIVE
Glucose, UA: NEGATIVE
Hgb urine dipstick: NEGATIVE
Ketones, ur: NEGATIVE
Leukocytes,Ua: NEGATIVE
Nitrite: NEGATIVE
Protein, ur: NEGATIVE
Specific Gravity, Urine: 1.008 (ref 1.001–1.035)
pH: 6 (ref 5.0–8.0)

## 2021-02-27 LAB — CBC WITH DIFFERENTIAL/PLATELET
Absolute Monocytes: 372 cells/uL (ref 200–950)
Basophils Absolute: 50 cells/uL (ref 0–200)
Basophils Relative: 0.8 %
Eosinophils Absolute: 239 cells/uL (ref 15–500)
Eosinophils Relative: 3.8 %
HCT: 43.8 % (ref 38.5–50.0)
Hemoglobin: 14.8 g/dL (ref 13.2–17.1)
Lymphs Abs: 1877 cells/uL (ref 850–3900)
MCH: 31.3 pg (ref 27.0–33.0)
MCHC: 33.8 g/dL (ref 32.0–36.0)
MCV: 92.6 fL (ref 80.0–100.0)
MPV: 10.5 fL (ref 7.5–12.5)
Monocytes Relative: 5.9 %
Neutro Abs: 3761 cells/uL (ref 1500–7800)
Neutrophils Relative %: 59.7 %
Platelets: 182 10*3/uL (ref 140–400)
RBC: 4.73 10*6/uL (ref 4.20–5.80)
RDW: 12.6 % (ref 11.0–15.0)
Total Lymphocyte: 29.8 %
WBC: 6.3 10*3/uL (ref 3.8–10.8)

## 2021-02-27 LAB — LIPID PANEL
Cholesterol: 133 mg/dL (ref <200)
HDL: 47 mg/dL (ref >=40)
LDL Cholesterol (Calc): 70 mg/dL (calc)
Non-HDL Cholesterol (Calc): 86 mg/dL (calc) (ref <130)
Total CHOL/HDL Ratio: 2.8 (calc) (ref <5.0)
Triglycerides: 78 mg/dL (ref <150)

## 2021-02-27 LAB — TSH: TSH: 1.42 mIU/L (ref 0.40–4.50)

## 2021-02-27 LAB — COMPLETE METABOLIC PANEL WITH GFR
AG Ratio: 2.1 (calc) (ref 1.0–2.5)
ALT: 56 U/L — ABNORMAL HIGH (ref 9–46)
AST: 37 U/L (ref 10–40)
Albumin: 4.5 g/dL (ref 3.6–5.1)
Alkaline phosphatase (APISO): 47 U/L (ref 36–130)
BUN: 24 mg/dL (ref 7–25)
CO2: 27 mmol/L (ref 20–32)
Calcium: 9.5 mg/dL (ref 8.6–10.3)
Chloride: 104 mmol/L (ref 98–110)
Creat: 0.86 mg/dL (ref 0.60–1.29)
Globulin: 2.1 g/dL (calc) (ref 1.9–3.7)
Glucose, Bld: 88 mg/dL (ref 65–99)
Potassium: 3.9 mmol/L (ref 3.5–5.3)
Sodium: 138 mmol/L (ref 135–146)
Total Bilirubin: 0.7 mg/dL (ref 0.2–1.2)
Total Protein: 6.6 g/dL (ref 6.1–8.1)
eGFR: 107 mL/min/{1.73_m2} (ref >=60)

## 2021-02-27 LAB — MAGNESIUM: Magnesium: 1.9 mg/dL (ref 1.5–2.5)

## 2021-02-27 LAB — MICROALBUMIN / CREATININE URINE RATIO
Creatinine, Urine: 34 mg/dL (ref 20–320)
Microalb, Ur: <0.2 mg/dL

## 2021-03-04 ENCOUNTER — Encounter: Payer: Self-pay | Admitting: Gastroenterology

## 2021-04-24 ENCOUNTER — Ambulatory Visit: Payer: BC Managed Care – PPO | Admitting: Internal Medicine

## 2021-04-24 ENCOUNTER — Encounter: Payer: Self-pay | Admitting: Internal Medicine

## 2021-04-24 ENCOUNTER — Other Ambulatory Visit: Payer: Self-pay

## 2021-04-24 VITALS — BP 121/61 | HR 53 | Temp 97.9°F | Resp 16 | Ht 69.0 in | Wt 235.6 lb

## 2021-04-24 DIAGNOSIS — M19032 Primary osteoarthritis, left wrist: Secondary | ICD-10-CM

## 2021-04-24 DIAGNOSIS — M79645 Pain in left finger(s): Secondary | ICD-10-CM

## 2021-04-24 MED ORDER — DEXAMETHASONE 4 MG PO TABS: ORAL_TABLET | ORAL | 0 refills | Status: DC

## 2021-04-24 MED ORDER — MELOXICAM 15 MG PO TABS: ORAL_TABLET | ORAL | 3 refills | Status: DC

## 2021-04-24 NOTE — Progress Notes (Signed)
° ° °  Future Appointments  Date Time Provider Department  04/24/2021  3:30 PM Lucky Cowboy, MD [facility]  04/30/2021  9:00 AM [facility] RM [facility]  05/16/2021  8:30 AM Jenel Lucks, MD [facility]  08/27/2021  4:00 PM [name], NP [facility]  02/26/2022  3:00 PM [name], NP [facility]    History of Present Illness:     Patient is a very nice RH dominant 50 yo MWM presenting with c/o pain at the base of his Lt thumb for several months. Denies any injury , but relates uses Lt hand as much as his Rt except for writing.  Has tried Ibuprofen with some relief.  Medications    atorvastatin  80 MG tablet, TAKE 1 TABLET EVERY DAY FOR CHOLESTEROL    FLONASE nasal spray, Place 1 spray into both nostrils daily as needed.    VITAMIN D 2,000 u, Take 3 tablets (6,000 Units)  daily.   sertraline 100 MG tablet, Take 1 tablet Daily for Mood  Problem list He has Labile HTN; Overweight (BMI 25.0-29.9); Hyperlipidemia; Vitamin D deficiency; OSA (obstructive sleep apnea); Other abnormal glucose (hx of prediabetes); Anxiety; and Right hip pain on their problem list.   Observations/Objective:  BP 121/61    Pulse (!) 53    Temp 97.9 F (36.6 C)    Resp 16    Ht 5\' 9"  (1.753 m)    Wt 235 lb 9.6 oz (106.9 kg)    SpO2 96%    BMI 34.79 kg/m   HEENT - WNL. Neck - supple.  Chest - Clear equal BS. Cor - Nl HS. RRR w/o sig MGR. PP 1(+). No edema. MS- FROM w/o deformities.  (+) point tenderness at base of Lt thumb w/o deformity or apparent redness or STS. Gait Nl. Neuro -  Nl w/o focal abnormalities.  Assessment and Plan:   1. Pain of left thumb  - !st use  - dexamethasone 4 MG tablet;  Take 1 tab 3 x day - 3 days, then 2 x day - 3 days, then 1 tab daily   Dispense: 20 tablet; Refill: 0  - Then 2sd switch to  -meloxicam (MOBIC) 15 MG tablet;  Take 1/2 to 1 tablet Daily   Dispense: 90 tablet; Refill: 3  Discussed rotating dose: 1/2 tab  on 1st day - 1 whole tab 2sd day-  skip dose 3rd day & repeating cycle every 3 days   2. CMC DJD (carpometacarpal degenerative joint disease), localized primary, left    Follow Up Instructions:      I discussed the assessment and treatment plan with the patient. The patient was provided an opportunity to ask questions and all were answered. The patient agreed with the plan and demonstrated an understanding of the instructions.       The patient was advised to call back if not significantly improved to call for referral to hand specialist   , MD

## 2021-04-25 ENCOUNTER — Encounter: Payer: No Typology Code available for payment source | Admitting: Gastroenterology

## 2021-04-30 ENCOUNTER — Ambulatory Visit (AMBULATORY_SURGERY_CENTER): Payer: Self-pay | Admitting: *Deleted

## 2021-04-30 ENCOUNTER — Other Ambulatory Visit: Payer: Self-pay

## 2021-04-30 VITALS — Ht 70.0 in | Wt 220.0 lb

## 2021-04-30 DIAGNOSIS — Z1211 Encounter for screening for malignant neoplasm of colon: Secondary | ICD-10-CM

## 2021-04-30 MED ORDER — NA SULFATE-K SULFATE-MG SULF 17.5-3.13-1.6 GM/177ML PO SOLN: 1.0000 | Freq: Once | ORAL | 0 refills | Status: AC

## 2021-04-30 NOTE — Progress Notes (Signed)
Virtual pre visit completed over telephone.  Instructions  forwarded through [facility] and secure e-mail [email or web address]     No egg or soy allergy known to patient  No issues known to pt with past sedation with any surgeries or procedures Patient denies ever being told they had issues or difficulty with intubation  No FH of Malignant Hyperthermia Pt is not on diet pills Pt is not on  home 02  Pt is not on blood thinners  Pt denies issues with constipation  No A fib or A flutter       Due to the COVID-19 pandemic we are asking patients to follow certain guidelines in [facility] and the [facility]   Pt aware of COVID protocols and [facility] guidelines

## 2021-05-01 ENCOUNTER — Telehealth: Payer: Self-pay | Admitting: Gastroenterology

## 2021-05-01 ENCOUNTER — Other Ambulatory Visit: Payer: Self-pay

## 2021-05-01 MED ORDER — PEG 3350-KCL-NA BICARB-NACL 420 G PO SOLR: 4000.0000 mL | Freq: Once | ORAL | 0 refills | Status: AC

## 2021-05-01 NOTE — Telephone Encounter (Signed)
Sent new prep to pharmacy [facility]) and sent patient new instructions via mychart.

## 2021-05-01 NOTE — Telephone Encounter (Signed)
Patient called and stated that insurance will not cover the Suprep for his procedure on 1/6. Seeking vice, please advise.

## 2021-05-13 ENCOUNTER — Other Ambulatory Visit: Payer: Self-pay | Admitting: Adult Health

## 2021-05-13 MED ORDER — VITAMIN D 50 MCG (2000 UT) PO TABS: 6000.0000 [IU] | ORAL_TABLET | Freq: Every day | ORAL | Status: AC

## 2021-05-14 ENCOUNTER — Encounter: Payer: Self-pay | Admitting: Gastroenterology

## 2021-05-15 ENCOUNTER — Encounter: Payer: Self-pay | Admitting: Adult Health

## 2021-05-16 ENCOUNTER — Encounter: Payer: No Typology Code available for payment source | Admitting: Gastroenterology

## 2021-06-22 NOTE — Progress Notes (Signed)
     C  A N  C  E L  L  E  D          

## 2021-06-23 ENCOUNTER — Ambulatory Visit: Payer: BC Managed Care – PPO | Admitting: Internal Medicine

## 2021-07-16 ENCOUNTER — Other Ambulatory Visit: Payer: Self-pay

## 2021-07-16 ENCOUNTER — Encounter: Payer: Self-pay | Admitting: Adult Health

## 2021-07-16 ENCOUNTER — Ambulatory Visit: Payer: BC Managed Care – PPO | Admitting: Adult Health

## 2021-07-16 VITALS — BP 120/76 | HR 61 | Temp 97.7°F | Wt 222.0 lb

## 2021-07-16 DIAGNOSIS — M79643 Pain in unspecified hand: Secondary | ICD-10-CM

## 2021-07-16 MED ORDER — DICLOFENAC SODIUM 1 % EX GEL: 2.0000 g | Freq: Four times a day (QID) | CUTANEOUS | 3 refills | Status: DC

## 2021-07-16 NOTE — Progress Notes (Signed)
Assessment and Plan: ? ?Burrel was seen today for arthritis. ? ?Diagnoses and all orders for this visit: ? ?Non-articular hand pain, unspecified laterality ?Most likely muscular pain from repetitive strain position from work ipad use; no improvement with meloxicam, based on exam I see no benefit in imaging at this point. Discussed modifying how he holds ipad to avoid carrying excess strain in hands. He is in agreement with plan, will try different straps, frequent rest, massage, stretching of hand muscles. Follow up if not improving or any new weakness, numbness/tingling, etc. Consider PT referral if needed -  ? ? ?Further disposition pending results of labs. Discussed med's effects and SE's.   ?Over 15 minutes of exam, counseling, chart review, and critical decision making was performed.  ? ?Future Appointments  ?Date Time Provider Department Center  ?08/27/2021  4:00 PM [name], NP [facility] None  ?02/26/2022  3:00 PM [name], NP [facility] None  ? ? ?------------------------------------------------------------------------------------------------------------------ ? ? ?HPI ?BP 120/76   Pulse 61   Temp 97.7 ?F (36.5 ?C)   Wt 222 lb (100.7 kg)   SpO2 98%   BMI 31.85 kg/m?  ?51 y.o.male Rh dominant presents for evaluation of bil hand pain.  ? ?He denies any injury prior to onset; reports in the last 8-9 months has had aching pain in web area between 1st and 2nd digits, L > R. He reports pain is much worse after work, holds an ipad with strap in left hand with thumb extended, pain improves significantly over the weekend. Describes as a deep ache in musculature between digits. Has sensation of weakness with pain, but denies localizing joint pain, neck pain, numbness/tingling, changes in sensation. He was prescribed meloxicam 15 mg by Dr. Oneta Rack and has been taking daily without benefit.  ? ?Hx of R hand fractures, last xray 02/27/2013, hasn't bothered him recently per patient.  ? ? ? ?Past Medical  History:  ?Diagnosis Date  ? Arthritis   ? Hyperlipidemia   ? Morbid obesity with BMI of 45.0-49.9, adult (HCC) 09/27/2019  ? Sleep apnea   ?  ? ?Allergies  ?Allergen Reactions  ? Penicillins Hives  ?  Childhood allergy  ? ? ?Current Outpatient Medications on File Prior to Visit  ?Medication Sig  ? atorvastatin (LIPITOR) 80 MG tablet TAKE 1 TABLET BY MOUTH EVERY DAY FOR CHOLESTEROL  ? Cholecalciferol (VITAMIN D) 50 MCG (2000 UT) tablet Take 3 tablets (6,000 Units total) by mouth daily.  ? fluticasone (FLONASE) 50 MCG/ACT nasal spray Place 1 spray into both nostrils daily as needed.  ? meloxicam (MOBIC) 15 MG tablet Take 1/2 to 1 tablet Daily for Pain & Inflammation  ? sertraline (ZOLOFT) 100 MG tablet Take 1 tablet Daily for Mood  ? ?No current facility-administered medications on file prior to visit.  ? ? ?ROS: all negative except above.  ? ?Physical Exam: ? ?BP 120/76   Pulse 61   Temp 97.7 ?F (36.5 ?C)   Wt 222 lb (100.7 kg)   SpO2 98%   BMI 31.85 kg/m?  ? ?General Appearance: Well nourished, in no apparent distress. ?Eyes: PERRLA, conjunctiva no swelling or erythema ?ENT/Mouth: mask in place; Hearing normal.  ?Neck: Supple, thyroid normal.  ?Respiratory: Respiratory effort normal, BS equal bilaterally without rales, rhonchi, wheezing or stridor.  ?Cardio: RRR with no MRGs. Brisk peripheral pulses without edema.  ?Abdomen: Soft, + BS.  Non tender, no guarding, rebound, hernias, masses. ?Lymphatics: Non tender without lymphadenopathy.  ?Musculoskeletal: R hand with 4th and 5  th digit bony enlargement (chronic) without tenderness, pain, swelling. He has tenderness to deep palpation of bi thumb abductors without atrophy. Denies localizing pain in joints of fingers, wrist, hands. Neg phalen's. Cervical ROM intact. Bil hand and finger strength intact and symmetrical. Sensation intact throughout bil upper extremities.  ?Skin: Warm, dry without rashes, lesions, ecchymosis.  ?Neuro: Normal muscle tone ?Psych: Awake  and oriented X 3, normal affect, Insight and Judgment appropriate.  ? ?  ? ?Dan Maker, NP ?4:33 PM ?[facility] ? ?

## 2021-08-14 ENCOUNTER — Other Ambulatory Visit: Payer: Self-pay | Admitting: Adult Health Nurse Practitioner

## 2021-08-14 DIAGNOSIS — E785 Hyperlipidemia, unspecified: Secondary | ICD-10-CM

## 2021-08-27 ENCOUNTER — Ambulatory Visit: Payer: BC Managed Care – PPO | Admitting: Adult Health

## 2021-08-27 ENCOUNTER — Encounter: Payer: Self-pay | Admitting: Adult Health

## 2021-08-27 VITALS — BP 110/68 | HR 57 | Temp 97.7°F | Wt 220.0 lb

## 2021-08-27 DIAGNOSIS — E663 Overweight: Secondary | ICD-10-CM

## 2021-08-27 DIAGNOSIS — R7309 Other abnormal glucose: Secondary | ICD-10-CM | POA: Diagnosis not present

## 2021-08-27 DIAGNOSIS — E782 Mixed hyperlipidemia: Secondary | ICD-10-CM

## 2021-08-27 DIAGNOSIS — I10 Essential (primary) hypertension: Secondary | ICD-10-CM | POA: Diagnosis not present

## 2021-08-27 DIAGNOSIS — G4733 Obstructive sleep apnea (adult) (pediatric): Secondary | ICD-10-CM

## 2021-08-27 DIAGNOSIS — E559 Vitamin D deficiency, unspecified: Secondary | ICD-10-CM

## 2021-08-27 DIAGNOSIS — M79645 Pain in left finger(s): Secondary | ICD-10-CM

## 2021-08-27 DIAGNOSIS — F419 Anxiety disorder, unspecified: Secondary | ICD-10-CM

## 2021-08-27 DIAGNOSIS — Z79899 Other long term (current) drug therapy: Secondary | ICD-10-CM | POA: Diagnosis not present

## 2021-08-27 NOTE — Progress Notes (Signed)
?FOLLOW UP ? ?Assessment and Plan:  ? ?Cholesterol ?Currently near goal; continue statin  ?Lifestyle discussed, reduce animal fats ?Continue low cholesterol diet and exercise.  ?Check lipid panel.  ? ?Hx of prediabetes ?Recent A1Cs at goal ?Discussed diet/exercise, weight management  ?Defer A1C; check CMP ? ?Obesity - BMI 30 with co morbidities - chol, OSA ?Commended excellent weight loss progress, down 100 lb in last year with keto ?Risks of keto discussed, reviewed healthier modifications ?Long discussion about weight loss, diet, and exercise ?Recommended diet heavy in fruits and veggies and low in animal meats, cheeses, and dairy products, appropriate calorie intake ?Discussed ideal weight for height (below <170 lb) and initial weight goal (<200 lb) ?Will follow up in 3 months ? ?Vitamin D Def ?Below goal at last visit; increase to 7000 IU ?continue supplementation to maintain goal of 60-100 ?Defer Vit D level ? ?Depression/anxiety ?Continue sertraline 100 mg ?Monitor;  ?Lifestyle discussed: diet/exerise, sleep hygiene, stress management, hydration ?Contact office if mood not improving/progressively worsening ?Consider increase dose sertraline vs add wellbutrin  ? ?Continue diet and meds as discussed. Further disposition pending results of labs. Discussed med's effects and SE's.   ?Over 30 minutes of exam, counseling, chart review, and critical decision making was performed.  ? ?Future Appointments  ?Date Time Provider Department Center  ?02/26/2022  3:00 PM [name], NP [facility] None  ? ? ?---------------------------------------------------------------------------------------------------------------------- ? ?HPI ?51 y.o. male  presents for 6 month follow up on cholesterol,  Weight, mood, and vitamin D deficiency.  ? ?He is on zoloft 100 mg daily for mood/OCD, has been stable for several years. He has noted just slight increased agitation in the last 2 months or so, PHQ-9 of 4 today.  ? ? ?  08/27/2021   ?  4:20 PM 02/26/2021  ?  3:28 PM 08/01/2019  ?  4:23 PM  ?Depression screen PHQ 2/9  ?Decreased Interest 1 0 0  ?Down, Depressed, Hopeless 1 0 0  ?PHQ - 2 Score 2 0 0  ?Altered sleeping 1  0  ?Tired, decreased energy 0  0  ?Change in appetite 0  0  ?Feeling bad or failure about yourself  0  0  ?Trouble concentrating 1  0  ?Moving slowly or fidgety/restless 0  0  ?Suicidal thoughts 0  0  ?PHQ-9 Score 4  0  ?Difficult doing work/chores   Not difficult at all  ? ?He has OSA on CPAP, endorses 100% compliance with restorative sleep.  ? ?He has been having L thumb pain, has improved with voltaren gel and modifying ipad position at work.  ? ?BMI is Body mass index is 31.57 kg/m?., he has been working on diet and exercise, pushing water, watching portions, doing a modified keto hard start since June 2020 with his wife with excellent results, weight down from 315 lb 09/2017 ?No carb, no sugars, veggies and lean protein ?He is aiming to get to 200 lb or so.  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 ?Today their BP is BP: 110/68 ? He does workout. He denies chest pain, shortness of breath, dizziness. ? ? He is on cholesterol medication Atorvastatin 80 mg daily and  denies myalgias. His cholesterol is at goal. The cholesterol last visit was:   ?Lab Results  ?Component Value Date  ? CHOL 133 02/26/2021  ? HDL 47 02/26/2021  ? LDLCALC 70 02/26/2021  ? TRIG 78 02/26/2021  ? CHOLHDL 2.8 02/26/2021  ? ? He has been working on diet and exercise for glucose management (hx of prediabetes, a1c 5.7% IN 2017), and denies nausea, paresthesia of the feet, polydipsia, polyuria, visual disturbances and vomiting. Last A1C in the office was:  ?Lab Results  ?Component  Value Date  ? HGBA1C 5.1 01/04/2019  ? ? Last GFR:  ?Lab Results  ?Component Value Date  ? GFRNONAA 105 08/20/2020  ? ?Patient is on Vitamin D supplement, 6000 IU, taking daily   ?Lab Results  ?Component Value Date  ? VD25OH 58 08/20/2020  ?   ? ? ? ?Current Medications:  ?Current Outpatient Medications on File Prior to Visit  ?Medication Sig  ? atorvastatin (LIPITOR) 80 MG tablet TAKE 1 TABLET BY MOUTH EVERY DAY FOR CHOLESTEROL  ? Cholecalciferol (VITAMIN D) 50 MCG (2000 UT) tablet Take 3 tablets (6,000 Units total) by mouth daily.  ? diclofenac Sodium (VOLTAREN) 1 % GEL Apply 2 g topically 4 (four) times daily.  ? fluticasone (FLONASE) 50 MCG/ACT nasal spray Place 1 spray into both nostrils daily as needed.  ? meloxicam (MOBIC) 15 MG tablet Take 1/2 to 1 tablet Daily for Pain & Inflammation  ? sertraline (ZOLOFT) 100 MG tablet Take 1 tablet Daily for Mood  ? ?No current facility-administered medications on file prior to visit.  ? ? ? ?Allergies:  ?Allergies  ?Allergen Reactions  ? Penicillins Hives  ?  Childhood allergy  ?  ? ?Medical History:  ?Past Medical History:  ?Diagnosis Date  ? Arthritis   ? Hyperlipidemia   ? Morbid obesity with BMI of 45.0-49.9, adult (HCC) 09/27/2019  ? Sleep apnea   ? ?Family history- Reviewed and unchanged ?Social history- Reviewed and unchanged ? ? ?Review of Systems:  ?Review of Systems  ?Constitutional:  Negative for malaise/fatigue and weight loss.  ?HENT:  Negative for hearing loss and tinnitus.   ?Eyes:  Negative for blurred vision and double vision.  ?Respiratory:  Negative for cough, shortness of breath and wheezing.   ?Cardiovascular:  Negative for chest pain, palpitations, orthopnea, claudication and leg swelling.  ?Gastrointestinal:  Negative for abdominal pain, blood in stool, constipation, diarrhea, heartburn, melena, nausea and vomiting.  ?Genitourinary: Negative.   ?Musculoskeletal:  Negative for joint pain and myalgias.  ?Skin:  Negative for rash.  ?Neurological:   Negative for dizziness, tingling, sensory change, weakness and headaches.  ?Endo/Heme/Allergies:  Negative for polydipsia.  ?Psychiatric/Behavioral: Negative.    ?All other systems reviewed and are negative. ? ? ? ?Physical Exam: ?BP 110/68   Pulse (!) 57   Temp 97.7 ?F (36.5 ?C)   Wt 220 lb (99.8 kg)   SpO2 99%   BMI 31.57 kg/m?  ?Wt Readings from Last 3 Encounters:  ?08/27/21 220 lb (99.8 kg)  ?07/16/21 222 lb (100.7 kg)  ?04/30/21 220 lb (99.8 kg)  ? ?General Appearance: Well nourished, in no apparent distress. ?Eyes: PERRLA, EOMs, conjunctiva no swelling or erythema ?Sinuses: No Frontal/maxillary tenderness ?ENT/Mouth: Ext aud canals clear, TMs without erythema, bulging. No erythema, swelling, or exudate on post pharynx.  Tonsils not swollen or erythematous. Hearing normal.  ?Neck: Supple, thyroid normal.  ?Respiratory: Respiratory effort normal, BS equal bilaterally without rales, rhonchi, wheezing or stridor.  ?Cardio: RRR with no  MRGs. Brisk peripheral pulses without edema.  ?Abdomen: Soft, + BS.  Non tender, no guarding, rebound, hernias, masses. ?Lymphatics: Non tender without lymphadenopathy.  ?Musculoskeletal: Full ROM, 5/5 strength, Normal gait.  ?Skin: Warm, dry without rashes, lesions, ecchymosis.  ?Neuro: Cranial nerves intact. No cerebellar symptoms.  ?Psych: Awake and oriented X 3, normal affect, Insight and Judgment appropriate.  ? ? ?Dan MakerAshley C Lucan Riner, NP ?4:20 PM ?[facility] ? ?

## 2021-08-28 ENCOUNTER — Other Ambulatory Visit: Payer: Self-pay | Admitting: Adult Health

## 2021-08-28 ENCOUNTER — Encounter: Payer: Self-pay | Admitting: Adult Health

## 2021-08-28 LAB — LIPID PANEL
Cholesterol: 138 mg/dL (ref <200)
HDL: 52 mg/dL (ref >=40)
LDL Cholesterol (Calc): 66 mg/dL (calc)
Non-HDL Cholesterol (Calc): 86 mg/dL (calc) (ref <130)
Total CHOL/HDL Ratio: 2.7 (calc) (ref <5.0)
Triglycerides: 117 mg/dL (ref <150)

## 2021-08-28 LAB — CBC WITH DIFFERENTIAL/PLATELET
Absolute Monocytes: 410 cells/uL (ref 200–950)
Basophils Absolute: 33 cells/uL (ref 0–200)
Basophils Relative: 0.5 %
Eosinophils Absolute: 189 cells/uL (ref 15–500)
Eosinophils Relative: 2.9 %
HCT: 41.5 % (ref 38.5–50.0)
Hemoglobin: 13.5 g/dL (ref 13.2–17.1)
Lymphs Abs: 2087 cells/uL (ref 850–3900)
MCH: 31.1 pg (ref 27.0–33.0)
MCHC: 32.5 g/dL (ref 32.0–36.0)
MCV: 95.6 fL (ref 80.0–100.0)
MPV: 10.3 fL (ref 7.5–12.5)
Monocytes Relative: 6.3 %
Neutro Abs: 3783 cells/uL (ref 1500–7800)
Neutrophils Relative %: 58.2 %
Platelets: 170 10*3/uL (ref 140–400)
RBC: 4.34 10*6/uL (ref 4.20–5.80)
RDW: 12.9 % (ref 11.0–15.0)
Total Lymphocyte: 32.1 %
WBC: 6.5 10*3/uL (ref 3.8–10.8)

## 2021-08-28 LAB — COMPLETE METABOLIC PANEL WITH GFR
AG Ratio: 2.3 (calc) (ref 1.0–2.5)
ALT: 37 U/L (ref 9–46)
AST: 27 U/L (ref 10–40)
Albumin: 4.3 g/dL (ref 3.6–5.1)
Alkaline phosphatase (APISO): 48 U/L (ref 36–130)
BUN/Creatinine Ratio: 32 (calc) — ABNORMAL HIGH (ref 6–22)
BUN: 26 mg/dL — ABNORMAL HIGH (ref 7–25)
CO2: 26 mmol/L (ref 20–32)
Calcium: 9.3 mg/dL (ref 8.6–10.3)
Chloride: 106 mmol/L (ref 98–110)
Creat: 0.81 mg/dL (ref 0.60–1.29)
Globulin: 1.9 g/dL (calc) (ref 1.9–3.7)
Glucose, Bld: 89 mg/dL (ref 65–99)
Potassium: 4.2 mmol/L (ref 3.5–5.3)
Sodium: 141 mmol/L (ref 135–146)
Total Bilirubin: 0.6 mg/dL (ref 0.2–1.2)
Total Protein: 6.2 g/dL (ref 6.1–8.1)
eGFR: 109 mL/min/{1.73_m2} (ref >=60)

## 2021-08-28 LAB — TSH: TSH: 1.31 mIU/L (ref 0.40–4.50)

## 2021-08-28 LAB — MAGNESIUM: Magnesium: 2 mg/dL (ref 1.5–2.5)

## 2021-08-28 MED ORDER — BUPROPION HCL ER (XL) 150 MG PO TB24: ORAL_TABLET | ORAL | 3 refills | Status: DC

## 2021-10-07 ENCOUNTER — Other Ambulatory Visit: Payer: Self-pay | Admitting: Internal Medicine

## 2021-10-07 DIAGNOSIS — F32A Depression, unspecified: Secondary | ICD-10-CM

## 2021-11-21 DIAGNOSIS — K648 Other hemorrhoids: Secondary | ICD-10-CM | POA: Diagnosis not present

## 2021-11-21 DIAGNOSIS — Z1211 Encounter for screening for malignant neoplasm of colon: Secondary | ICD-10-CM | POA: Diagnosis not present

## 2021-11-21 DIAGNOSIS — D125 Benign neoplasm of sigmoid colon: Secondary | ICD-10-CM | POA: Diagnosis not present

## 2021-11-21 LAB — HM COLONOSCOPY

## 2021-12-10 ENCOUNTER — Encounter: Payer: Self-pay | Admitting: Internal Medicine

## 2021-12-17 DIAGNOSIS — L814 Other melanin hyperpigmentation: Secondary | ICD-10-CM | POA: Diagnosis not present

## 2021-12-17 DIAGNOSIS — D225 Melanocytic nevi of trunk: Secondary | ICD-10-CM | POA: Diagnosis not present

## 2022-01-29 ENCOUNTER — Encounter: Payer: BC Managed Care – PPO | Admitting: Adult Health

## 2022-02-26 ENCOUNTER — Encounter: Payer: BC Managed Care – PPO | Admitting: Nurse Practitioner

## 2022-03-16 ENCOUNTER — Encounter: Payer: BC Managed Care – PPO | Admitting: Nurse Practitioner

## 2022-03-24 ENCOUNTER — Encounter: Payer: BC Managed Care – PPO | Admitting: Nurse Practitioner

## 2022-04-15 ENCOUNTER — Encounter: Payer: BC Managed Care – PPO | Admitting: Nurse Practitioner

## 2022-05-26 ENCOUNTER — Encounter: Payer: Self-pay | Admitting: Nurse Practitioner

## 2022-05-26 ENCOUNTER — Ambulatory Visit (INDEPENDENT_AMBULATORY_CARE_PROVIDER_SITE_OTHER): Payer: BC Managed Care – PPO | Admitting: Nurse Practitioner

## 2022-05-26 VITALS — BP 110/78 | HR 61 | Temp 97.5°F | Ht 70.0 in | Wt 245.8 lb

## 2022-05-26 DIAGNOSIS — G4733 Obstructive sleep apnea (adult) (pediatric): Secondary | ICD-10-CM

## 2022-05-26 DIAGNOSIS — Z0001 Encounter for general adult medical examination with abnormal findings: Secondary | ICD-10-CM

## 2022-05-26 DIAGNOSIS — I10 Essential (primary) hypertension: Secondary | ICD-10-CM | POA: Diagnosis not present

## 2022-05-26 DIAGNOSIS — Z131 Encounter for screening for diabetes mellitus: Secondary | ICD-10-CM | POA: Diagnosis not present

## 2022-05-26 DIAGNOSIS — E782 Mixed hyperlipidemia: Secondary | ICD-10-CM

## 2022-05-26 DIAGNOSIS — Z0000 Encounter for general adult medical examination without abnormal findings: Secondary | ICD-10-CM

## 2022-05-26 DIAGNOSIS — E559 Vitamin D deficiency, unspecified: Secondary | ICD-10-CM

## 2022-05-26 DIAGNOSIS — R7309 Other abnormal glucose: Secondary | ICD-10-CM

## 2022-05-26 DIAGNOSIS — Z1389 Encounter for screening for other disorder: Secondary | ICD-10-CM

## 2022-05-26 DIAGNOSIS — Z79899 Other long term (current) drug therapy: Secondary | ICD-10-CM

## 2022-05-26 DIAGNOSIS — Z1329 Encounter for screening for other suspected endocrine disorder: Secondary | ICD-10-CM

## 2022-05-26 DIAGNOSIS — Z136 Encounter for screening for cardiovascular disorders: Secondary | ICD-10-CM | POA: Diagnosis not present

## 2022-05-26 DIAGNOSIS — F419 Anxiety disorder, unspecified: Secondary | ICD-10-CM

## 2022-05-26 DIAGNOSIS — Z13220 Encounter for screening for lipoid disorders: Secondary | ICD-10-CM | POA: Diagnosis not present

## 2022-05-26 DIAGNOSIS — H6591 Unspecified nonsuppurative otitis media, right ear: Secondary | ICD-10-CM

## 2022-05-26 NOTE — Progress Notes (Signed)
Complete Physical  Assessment and Plan:  Giovanny was seen today for annual exam.  Diagnoses and all orders for this visit:  Encounter for Annual Physical Exam with abnormal findings Due annually  Health Maintenance reviewed Healthy lifestyle reviewed and goals set  Essential hypertension Treated via lifestyle modifications. Discussed DASH (Dietary Approaches to Stop Hypertension) DASH diet is lower in sodium than a typical American diet. Cut back on foods that are high in saturated fat, cholesterol, and trans fats. Eat more whole-grain foods, fish, poultry, and nuts Remain active and exercise as tolerated daily.  Monitor BP at home-Call if greater than 130/80.  Check CMP/CBC  OSA (obstructive sleep apnea) Wears CPAP, 100%, working well   Vitamin D deficiency At goal recent check Continue supplementation for goal of 60-100 Check and monitor levels  Other abnormal glucose Education: Reviewed 'ABCs' of diabetes management  Discussed goals to be met and/or maintained include A1C (<7) Blood pressure (<130/80) Cholesterol (LDL <70) Continue Eye Exam yearly  Continue Dental Exam Q6 mo Discussed dietary recommendations Discussed Physical Activity recommendations Check A1C  Obesity - BMI 35 Discussed appropriate BMI Goal of losing 1 lb per month. Diet modification. Physical activity. Encouraged/praised to build confidence.   Mixed hyperlipidemia Discussed lifestyle modifications. Recommended diet heavy in fruits and veggies, omega 3's. Decrease consumption of animal meats, cheeses, and dairy products. Remain active and exercise as tolerated. Continue to monitor. Check lipids/TSH   Anxiety Well managed by current regimen; continue medications Stress management techniques discussed, increase water, good sleep hygiene discussed, increase exercise, and increase veggies.   Screening for cardiovascular condition EKG  Screening for proteinuria or  hematuria UA/Microalbumin  Screening for thyroid disorder TSH  Medication management All medications discussed and reviewed in full. All questions and concerns regarding medications addressed.    Right middle ear effusion Suggested home wash with alcohol and vinegar. If s/s fail to improve referral to ENT. Continue to monitor   Orders Placed This Encounter  Procedures   CBC with Differential/Platelet   COMPLETE METABOLIC PANEL WITH GFR   Lipid panel   TSH   Hemoglobin A1c   Insulin, random   VITAMIN D 25 Hydroxy (Vit-D Deficiency, Fractures)   Urinalysis, Routine w reflex microscopic   Microalbumin / creatinine urine ratio   EKG 12-Lead    Notify office for further evaluation and treatment, questions or concerns if any reported s/s fail to improve.   The patient was advised to call back or seek an in-person evaluation if any symptoms worsen or if the condition fails to improve as anticipated.   Further disposition pending results of labs. Discussed med's effects and SE's.    I discussed the assessment and treatment plan with the patient. The patient was provided an opportunity to ask questions and all were answered. The patient agreed with the plan and demonstrated an understanding of the instructions.  Discussed med's effects and SE's. Screening labs and tests as requested with regular follow-up as recommended.  I provided 40 minutes of face-to-face time during this encounter including counseling, chart review, and critical decision making was preformed.   Future Appointments  Date Time Provider [place]  05/27/2023  3:00 PM Darrol Jump, NP [facility] None     HPI Patient presents for a complete physical. He has Labile HTN; Overweight (BMI 25.0-29.9); Hyperlipidemia; Vitamin D deficiency; OSA (obstructive sleep apnea); Other abnormal glucose (hx of prediabetes); Anxiety; Right hip pain; and Pain of left thumb on their problem list.   Overall he reports  feeling well.  He has no new or additional concerns today.  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  Reports work is going well.  Is admits to working around loud equipment.  Has some tinnitus in the right ear, feeling of fullness at times.  Feels as though he wants to constantly blow out of his nose to pop his ears.   He is on Zoloft and Wellbutrin daily for mood/OCD and feels he is doing well on this dose.   He has OSA on CPAP, endorses 100% compliance with restorative sleep and reports feeling well rested.  BMI is Body mass index is 35.27 kg/m., he has not been working on diet and exercised.  Has gained 25 lb since the summer.  States due to his diet.  He is stRarting an exercise regimen this week.   Wt Readings from Last 3 Encounters:  05/26/22 245 lb 12.8 oz (111.5 kg)  08/27/21 220 lb (99.8 kg)  07/16/21 222 lb (100.7 kg)   His blood pressure has been controlled at home, today their BP is BP: 110/78 He does workout. He denies chest pain, shortness of breath, dizziness.   He is on cholesterol medication (atorvastatin 80 mg daily) and denies myalgias. His LDL cholesterol is at goal. The cholesterol last visit was:   Lab Results  Component Value Date   CHOL 138 08/27/2021   HDL 52 08/27/2021   LDLCALC 66 08/27/2021   TRIG 117 08/27/2021   CHOLHDL 2.7 08/27/2021   He is working on losing weight by keto type diet. Last A1C in the office was:  Lab Results  Component Value Date   HGBA1C 5.1 01/04/2019   Patient is on Vitamin D supplement Lab Results  Component Value Date   VD25OH 58 08/20/2020    Current Medications:  Current Outpatient Medications on File Prior to Visit  Medication Sig Dispense Refill   buPROPion (WELLBUTRIN XL) 150 MG 24 hr tablet Take 1 tab daily in the morning for mood, focus and energy. 90 tablet 3   Cholecalciferol (VITAMIN D) 50 MCG (2000 UT) tablet Take 3 tablets (6,000 Units total) by mouth daily.     fluticasone (FLONASE) 50  MCG/ACT nasal spray Place 1 spray into both nostrils daily as needed. 16 g 2   sertraline (ZOLOFT) 100 MG tablet TAKE 1 TABLET BY MOUTH DAILY FOR MOOD 90 tablet 3   atorvastatin (LIPITOR) 80 MG tablet TAKE 1 TABLET BY MOUTH EVERY DAY FOR CHOLESTEROL (Patient not taking: Reported on 05/26/2022) 90 tablet 3   diclofenac Sodium (VOLTAREN) 1 % GEL Apply 2 g topically 4 (four) times daily. (Patient not taking: Reported on 05/26/2022) 150 g 3   No current facility-administered medications on file prior to visit.    Health Maintenance:  Immunization History  Administered Date(s) Administered   Influenza,inj,quad, With Preservative 02/20/2016   PFIZER(Purple Top)SARS-COV-2 Vaccination 08/01/2019, 08/24/2019   Tetanus 02/20/2016    Tetanus: 02/20/16 Flu vaccine: declines  Covid 19: 2/2, pfizer  Colonoscopy: 11/2021 - 5 year recall Due 2028  Eye Exam:  [place] 2023, last visit 2020, wears glasses, goes annually, has planned in 2023 Dentist:  Dr. Elroy Channel, Twice yearly visits, 2022, goes q60m  Patient Care Team: Lucky Cowboy, MD as PCP - General (Internal Medicine)  Allergies:  Allergies  Allergen Reactions   Penicillins Hives    Childhood allergy    Medical History:  Past Medical History:  Diagnosis Date   Arthritis  Hyperlipidemia    Morbid obesity with BMI of 45.0-49.9, adult ([place]) 09/27/2019   Sleep apnea     Surgical History:  Past Surgical History:  Procedure Laterality Date   KNEE SURGERY Right 1991   arthroscopic, "cleaning out"    ORIF FINGER FRACTURE Right 2017   right pinky   TONSILLECTOMY Bilateral 1983   VASECTOMY  2014   ? Dr. Karsten Ro    Family History:  Family History  Problem Relation Age of Onset   Skin cancer Father 43       ? melanoma   Cancer - Other Paternal Grandfather        gallbladder   Colon cancer Neg Hx    Colon polyps Neg Hx    Esophageal cancer Neg Hx    Rectal cancer Neg Hx    Stomach cancer Neg Hx     Social History:    Social History   Tobacco Use   Smoking status: Never   Smokeless tobacco: Never  Vaping Use   Vaping Use: Never used  Substance Use Topics   Alcohol use: Yes    Alcohol/week: 0.0 standard drinks of alcohol    Comment: occasional   Drug use: No    Review of Systems:  Review of Systems  Constitutional:  Negative for malaise/fatigue and weight loss.  HENT:  Negative for hearing loss and tinnitus.   Eyes:  Negative for blurred vision and double vision.  Respiratory:  Negative for cough, sputum production, shortness of breath and wheezing.   Cardiovascular:  Negative for chest pain, palpitations, orthopnea, claudication, leg swelling and PND.  Gastrointestinal:  Negative for abdominal pain, blood in stool, constipation, diarrhea, heartburn, melena, nausea and vomiting.  Genitourinary: Negative.   Musculoskeletal:  Negative for joint pain and myalgias.  Skin:  Negative for rash.  Neurological:  Negative for dizziness, tingling, sensory change, weakness and headaches.  Endo/Heme/Allergies:  Negative for polydipsia.  Psychiatric/Behavioral: Negative.  Negative for depression, memory loss, substance abuse and suicidal ideas. The patient is not nervous/anxious and does not have insomnia.   All other systems reviewed and are negative.   Physical Exam: Estimated body mass index is 35.27 kg/m as calculated from the following:   Height as of this encounter: 5\' 10"  (1.778 m).   Weight as of this encounter: 245 lb 12.8 oz (111.5 kg). BP 110/78   Pulse 61   Temp (!) 97.5 F (36.4 C)   Ht 5\' 10"  (1.778 m)   Wt 245 lb 12.8 oz (111.5 kg)   SpO2 97%   BMI 35.27 kg/m   General Appearance: Well nourished, in no apparent distress.  Eyes: PERRLA, EOMs, conjunctiva no swelling or erythema ENT/Mouth: Ear canals clear bilaterally with no erythema, swelling, discharge.  Right TM with effusion, no erythema or edema, or tenderness to palpation.  Left TM WNL with no erythema, bulging, or  retractions.  Oropharynx clear and moist with no exudate, swelling, or erythema.  Dentition normal.   Neck: Supple, thyroid normal. No bruits, JVD, cervical adenopathy Respiratory: Respiratory effort normal, BS equal bilaterally without rales, rhonchi, wheezing or stridor.  Cardio: RRR without murmurs, rubs or gallops. Brisk peripheral pulses without edema.  Chest: symmetric, with normal excursions Abdomen: Soft, nontender, no guarding, rebound, hernias, masses, or organomegaly. Genitourinary: Defer, no concerns Musculoskeletal: Full ROM all peripheral extremities,5/5 strength, and normal gait.  Skin: Warm, dry without rashes, lesions, ecchymosis. Benign appearing moles and cherry angiomas.  Neuro: A&Ox3, Cranial nerves intact, reflexes equal bilaterally. Normal muscle  tone, no cerebellar symptoms. Sensation intact.  Psych: Normal affect, Insight and Judgment appropriate.   EKG: Sinus brady, NSR   Over 40 minutes of exam, counseling, chart review and critical decision making was performed  Josede Cicero 3:38 PM [facility]

## 2022-05-26 NOTE — Patient Instructions (Signed)
Suggested ear wash recipe: 1 teaspoon of white vinegar. 1 teaspoon of rubbing alcohol. Mix the ingredients together in a cup. Use an eyedropper to put most of the mixture in the ear. Tilt the head to be sure the earwash runs all the way down inside the ear. Then tilt the head the other way and let the liquid drain out. Continue to monitor.    Otitis Media With Effusion, Adult  Otitis media with effusion (OME) is inflammation and fluid (effusion) in the middle ear without having an ear infection. The middle ear is the space behind the eardrum. The middle ear is connected to the back of the throat by a narrow tube (eustachian tube). Normally the eustachian tube drains fluid out of the middle ear. A swollen eustachian tube can become blocked and cause fluid to collect in the middle ear. OME often goes away without treatment. Sometimes OME can lead to hearing problems and recurrent acute ear infections (acute otitis media). These conditions may require treatment. What are the causes? OME is caused by a blocked eustachian tube. This can result from: Allergies. Upper respiratory infections. Enlarged adenoids. The adenoids are areas of soft tissue located high in the back of the throat, behind the nose and the roof of the mouth. They are part of the body's natural defense system (immune system). Rapid changes in pressure, like when an airplane is descending or during scuba diving. In some cases, the cause of this condition is not known. What are the signs or symptoms? Common symptoms of this condition include: A feeling of fullness in your ear. Decreased hearing in the affected ear. Fluid draining into the ear canal. Pain in the ear. In some cases, there are no symptoms. How is this diagnosed?  A health care provider can diagnose OME based on signs and symptoms of the condition. Your provider will also do a physical exam to check for fluid behind the eardrum. During the exam, your health care  provider will use an instrument called an otoscope to look in your ear. Your health care provider may do other tests, such as: A hearing test. A tympanogram. This is a test that shows how well the eardrum moves in response to air pressure in the ear canal. It provides a graph for your health care provider to review. A pneumatic otoscopy. This is a test to check how your eardrum moves in response to changes in pressure. It is done by squeezing a small amount of air into the ear. How is this treated? Treatment for OME depends on the cause of the condition and the severity of symptoms. The first step is often waiting to see if the fluid drains on its own in a few weeks. Home care treatment may include: Over-the-counter pain relievers. A warm, moist cloth placed over the ear. Severe cases may require a procedure to insert tubes in the ears (tympanostomy tubes) to drain the fluid. Follow these instructions at home: Take over-the-counter and prescription medicines only as told by your health care provider. Keep all follow-up visits. Contact a health care provider if: You have pain that gets worse. Hearing in your affected ear gets worse. You have fluid draining from your ear canal. You have dizziness. You develop a fever. Get help right away if: You develop a severe headache. You completely lose hearing in the affected ear. You have bleeding from your ear canal. You have sudden and severe pain in your ear. These symptoms may represent a serious problem that is  an emergency. Do not wait to see if the symptoms will go away. Get medical help right away. Call your local emergency services (911 in the [place] Do not drive yourself to the hospital. Summary Otitis media with effusion (OME) is inflammation and fluid (effusion) in the middle ear without having an ear infection. A swollen eustachian tube can become blocked and cause fluid to collect in the middle ear. Treatment for OME depends on the  cause of the condition and the severity of symptoms. Many times, treatment is not needed because the fluid drains on its own in a few weeks. Sometimes OME can lead to hearing problems and recurrent acute ear infections (acute otitis media), which may require treatment. This information is not intended to replace advice given to you by your health care provider. Make sure you discuss any questions you have with your health care provider. Document Revised: 08/22/2020 Document Reviewed: 08/22/2020 Elsevier Patient Education  [place].

## 2022-05-27 LAB — COMPLETE METABOLIC PANEL WITH GFR
AG Ratio: 1.7 (calc) (ref 1.0–2.5)
ALT: 29 U/L (ref 9–46)
AST: 27 U/L (ref 10–40)
Albumin: 4.3 g/dL (ref 3.6–5.1)
Alkaline phosphatase (APISO): 52 U/L (ref 36–130)
BUN/Creatinine Ratio: 26 (calc) — ABNORMAL HIGH (ref 6–22)
BUN: 27 mg/dL — ABNORMAL HIGH (ref 7–25)
CO2: 23 mmol/L (ref 20–32)
Calcium: 9.6 mg/dL (ref 8.6–10.3)
Chloride: 105 mmol/L (ref 98–110)
Creat: 1.04 mg/dL (ref 0.60–1.29)
Globulin: 2.6 g/dL (calc) (ref 1.9–3.7)
Glucose, Bld: 90 mg/dL (ref 65–99)
Potassium: 4.1 mmol/L (ref 3.5–5.3)
Sodium: 138 mmol/L (ref 135–146)
Total Bilirubin: 0.6 mg/dL (ref 0.2–1.2)
Total Protein: 6.9 g/dL (ref 6.1–8.1)
eGFR: 88 mL/min/{1.73_m2} (ref >=60)

## 2022-05-27 LAB — URINALYSIS, ROUTINE W REFLEX MICROSCOPIC
Bilirubin Urine: NEGATIVE
Glucose, UA: NEGATIVE
Hgb urine dipstick: NEGATIVE
Ketones, ur: NEGATIVE
Leukocytes,Ua: NEGATIVE
Nitrite: NEGATIVE
Protein, ur: NEGATIVE
Specific Gravity, Urine: 1.022 (ref 1.001–1.035)
pH: 6 (ref 5.0–8.0)

## 2022-05-27 LAB — LIPID PANEL
Cholesterol: 218 mg/dL — ABNORMAL HIGH (ref <200)
HDL: 62 mg/dL (ref >=40)
LDL Cholesterol (Calc): 135 mg/dL (calc) — ABNORMAL HIGH
Non-HDL Cholesterol (Calc): 156 mg/dL (calc) — ABNORMAL HIGH (ref <130)
Total CHOL/HDL Ratio: 3.5 (calc) (ref <5.0)
Triglycerides: 99 mg/dL (ref <150)

## 2022-05-27 LAB — CBC WITH DIFFERENTIAL/PLATELET
Absolute Monocytes: 574 cells/uL (ref 200–950)
Basophils Absolute: 53 cells/uL (ref 0–200)
Basophils Relative: 0.8 %
Eosinophils Absolute: 152 cells/uL (ref 15–500)
Eosinophils Relative: 2.3 %
HCT: 44.4 % (ref 38.5–50.0)
Hemoglobin: 15 g/dL (ref 13.2–17.1)
Lymphs Abs: 1828 cells/uL (ref 850–3900)
MCH: 30.6 pg (ref 27.0–33.0)
MCHC: 33.8 g/dL (ref 32.0–36.0)
MCV: 90.6 fL (ref 80.0–100.0)
MPV: 10.2 fL (ref 7.5–12.5)
Monocytes Relative: 8.7 %
Neutro Abs: 3993 cells/uL (ref 1500–7800)
Neutrophils Relative %: 60.5 %
Platelets: 159 10*3/uL (ref 140–400)
RBC: 4.9 10*6/uL (ref 4.20–5.80)
RDW: 12.9 % (ref 11.0–15.0)
Total Lymphocyte: 27.7 %
WBC: 6.6 10*3/uL (ref 3.8–10.8)

## 2022-05-27 LAB — MICROALBUMIN / CREATININE URINE RATIO
Creatinine, Urine: 126 mg/dL (ref 20–320)
Microalb, Ur: <0.2 mg/dL

## 2022-05-27 LAB — TSH: TSH: 1.42 mIU/L (ref 0.40–4.50)

## 2022-05-27 LAB — INSULIN, RANDOM: Insulin: 7.1 u[IU]/mL

## 2022-05-27 LAB — HEMOGLOBIN A1C
Hgb A1c MFr Bld: 5.4 % of total Hgb (ref <5.7)
Mean Plasma Glucose: 108 mg/dL
eAG (mmol/L): 6 mmol/L

## 2022-05-27 LAB — VITAMIN D 25 HYDROXY (VIT D DEFICIENCY, FRACTURES): Vit D, 25-Hydroxy: 46 ng/mL (ref 30–100)

## 2022-06-23 ENCOUNTER — Encounter: Payer: Self-pay | Admitting: Nurse Practitioner

## 2022-06-23 DIAGNOSIS — H9311 Tinnitus, right ear: Secondary | ICD-10-CM

## 2022-06-23 DIAGNOSIS — H6591 Unspecified nonsuppurative otitis media, right ear: Secondary | ICD-10-CM

## 2022-08-25 ENCOUNTER — Encounter: Payer: Self-pay | Admitting: Nurse Practitioner

## 2022-08-25 MED ORDER — BUPROPION HCL ER (XL) 150 MG PO TB24: ORAL_TABLET | ORAL | 3 refills | Status: DC

## 2022-08-30 ENCOUNTER — Encounter: Payer: Self-pay | Admitting: Nurse Practitioner

## 2022-09-08 DIAGNOSIS — L500 Allergic urticaria: Secondary | ICD-10-CM | POA: Diagnosis not present

## 2022-09-17 ENCOUNTER — Ambulatory Visit: Payer: BC Managed Care – PPO | Admitting: Nurse Practitioner

## 2022-09-24 DIAGNOSIS — H9311 Tinnitus, right ear: Secondary | ICD-10-CM | POA: Diagnosis not present

## 2022-09-24 DIAGNOSIS — H6991 Unspecified Eustachian tube disorder, right ear: Secondary | ICD-10-CM | POA: Diagnosis not present

## 2022-09-28 ENCOUNTER — Encounter: Payer: Self-pay | Admitting: Nurse Practitioner

## 2022-09-28 ENCOUNTER — Other Ambulatory Visit: Payer: Self-pay | Admitting: Internal Medicine

## 2022-09-28 MED ORDER — TADALAFIL 20 MG PO TABS: ORAL_TABLET | ORAL | 1 refills | Status: AC

## 2022-10-08 ENCOUNTER — Encounter: Payer: Self-pay | Admitting: Nurse Practitioner

## 2022-10-08 DIAGNOSIS — F32A Depression, unspecified: Secondary | ICD-10-CM

## 2022-10-09 MED ORDER — SERTRALINE HCL 100 MG PO TABS
ORAL_TABLET | ORAL | 3 refills | Status: DC
Start: 2022-10-09 — End: 2023-08-20

## 2022-11-09 ENCOUNTER — Encounter: Payer: Self-pay | Admitting: Nurse Practitioner

## 2022-11-09 DIAGNOSIS — E785 Hyperlipidemia, unspecified: Secondary | ICD-10-CM

## 2022-11-10 MED ORDER — ATORVASTATIN CALCIUM 80 MG PO TABS
ORAL_TABLET | ORAL | 3 refills | Status: DC
Start: 2022-11-10 — End: 2023-08-20

## 2022-11-24 ENCOUNTER — Ambulatory Visit: Payer: BC Managed Care – PPO | Admitting: Nurse Practitioner

## 2022-12-21 ENCOUNTER — Ambulatory Visit: Payer: BC Managed Care – PPO | Admitting: Nurse Practitioner

## 2022-12-21 ENCOUNTER — Encounter: Payer: Self-pay | Admitting: Nurse Practitioner

## 2022-12-21 VITALS — BP 104/80 | HR 54 | Temp 97.7°F | Ht 70.0 in | Wt 260.0 lb

## 2022-12-21 DIAGNOSIS — I10 Essential (primary) hypertension: Secondary | ICD-10-CM | POA: Diagnosis not present

## 2022-12-21 DIAGNOSIS — E559 Vitamin D deficiency, unspecified: Secondary | ICD-10-CM | POA: Diagnosis not present

## 2022-12-21 DIAGNOSIS — E782 Mixed hyperlipidemia: Secondary | ICD-10-CM

## 2022-12-21 DIAGNOSIS — Z79899 Other long term (current) drug therapy: Secondary | ICD-10-CM

## 2022-12-21 DIAGNOSIS — Z6835 Body mass index (BMI) 35.0-35.9, adult: Secondary | ICD-10-CM

## 2022-12-21 DIAGNOSIS — G4733 Obstructive sleep apnea (adult) (pediatric): Secondary | ICD-10-CM | POA: Diagnosis not present

## 2022-12-21 DIAGNOSIS — R7309 Other abnormal glucose: Secondary | ICD-10-CM

## 2022-12-21 DIAGNOSIS — F419 Anxiety disorder, unspecified: Secondary | ICD-10-CM

## 2022-12-21 NOTE — Progress Notes (Signed)
Follow Up  Assessment and Plan:  Mike Kramer was seen today for annual exam.  Diagnoses and all orders for this visit:  Primary hypertension Treated via lifestyle modifications. Discussed DASH (Dietary Approaches to Stop Hypertension) DASH diet is lower in sodium than a typical American diet. Cut back on foods that are high in saturated fat, cholesterol, and trans fats. Eat more whole-grain foods, fish, poultry, and nuts Remain active and exercise as tolerated daily.  Monitor BP at home-Call if greater than 130/80.  Check CMP/CBC  OSA (obstructive sleep apnea) Wears CPAP, 100%, working well   Vitamin D deficiency At goal recent check Continue supplementation for goal of 60-100 Check and monitor levels  Other abnormal glucose Education: Reviewed 'ABCs' of diabetes management  Discussed goals to be met and/or maintained include A1C (<7) Blood pressure (<130/80) Cholesterol (LDL <70) Continue Eye Exam yearly  Continue Dental Exam Q6 mo Discussed dietary recommendations Discussed Physical Activity recommendations Check A1C  Obesity - BMI 37 Discussed appropriate BMI Diet modification. Physical activity. Encouraged/praised to build confidence.  Mixed hyperlipidemia Discussed lifestyle modifications. Recommended diet heavy in fruits and veggies, omega 3's. Decrease consumption of animal meats, cheeses, and dairy products. Remain active and exercise as tolerated. Continue to monitor. Check lipids/TSH  Anxiety Increased Wellbutrin to BID Continue Zoloft Stress management techniques discussed, increase water, good sleep hygiene discussed, increase exercise, and increase veggies.   Medication management All medications discussed and reviewed in full. All questions and concerns regarding medications addressed.    Orders Placed This Encounter  Procedures   CBC with Differential/Platelet   COMPLETE METABOLIC PANEL WITH GFR   Lipid panel   Notify office for further  evaluation and treatment, questions or concerns if any reported s/s fail to improve.   The patient was advised to call back or seek an in-person evaluation if any symptoms worsen or if the condition fails to improve as anticipated.   Further disposition pending results of labs. Discussed med's effects and SE's.    I discussed the assessment and treatment plan with the patient. The patient was provided an opportunity to ask questions and all were answered. The patient agreed with the plan and demonstrated an understanding of the instructions.  Discussed med's effects and SE's. Screening labs and tests as requested with regular follow-up as recommended.  I provided 25 minutes of face-to-face time during this encounter including counseling, chart review, and critical decision making was preformed.   Future Appointments  Date Time Provider Department Center  05/27/2023  3:00 PM Adela Glimpse, NP [facility] None     HPI Patient presents for a general follow up. He has Labile HTN; Overweight (BMI 25.0-29.9); Hyperlipidemia; Vitamin D deficiency; OSA (obstructive sleep apnea); Other abnormal glucose (hx of prediabetes); Anxiety; Right hip pain; and Pain of left thumb on their problem list.   Overall he reports feeling well.    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  Reports work is going well.  Is admits to working around loud equipment.  Has some tinnitus in the right ear, feeling of fullness at times.    He is on Zoloft and Wellbutrin daily for mood/OCD and feels as though his mood has become more irritable.  Gets set off easily.  Prefers to be alone most days.  Does continue to enjoy family.  Bothered mostly by work.  Works 5 days a week 12 hours a day.  This can work on his overall mood.  Denies any extreme  mood swings, HI/SI.    He has OSA on CPAP, endorses 100% compliance with restorative sleep and reports feeling well rested.  BMI is Body mass index is 37.31 kg/m.,  he has not been working on diet and exercise.  Has does Keto in the past with effectiveness.  Wt Readings from Last 3 Encounters:  12/21/22 260 lb (117.9 kg)  05/26/22 245 lb 12.8 oz (111.5 kg)  08/27/21 220 lb (99.8 kg)   His blood pressure has been controlled at home, today their BP is BP: 104/80 He does workout. He denies chest pain, shortness of breath, dizziness.   He is on cholesterol medication (atorvastatin 80 mg daily) and denies myalgias. His LDL cholesterol is at goal. The cholesterol last visit was:   Lab Results  Component Value Date   CHOL 218 (H) 05/26/2022   HDL 62 05/26/2022   LDLCALC 135 (H) 05/26/2022   TRIG 99 05/26/2022   CHOLHDL 3.5 05/26/2022   He is working on losing weight by keto type diet. Last A1C in the office was:  Lab Results  Component Value Date   HGBA1C 5.4 05/26/2022   Patient is on Vitamin D supplement Lab Results  Component Value Date   VD25OH 46 05/26/2022    Current Medications:  Current Outpatient Medications on File Prior to Visit  Medication Sig Dispense Refill   atorvastatin (LIPITOR) 80 MG tablet TAKE 1 TABLET BY MOUTH EVERY DAY FOR CHOLESTEROL 90 tablet 3   buPROPion (WELLBUTRIN XL) 150 MG 24 hr tablet Take 1 tab daily in the morning for mood, focus and energy. 90 tablet 3   Cholecalciferol (VITAMIN D) 50 MCG (2000 UT) tablet Take 3 tablets (6,000 Units total) by mouth daily.     fluticasone (FLONASE) 50 MCG/ACT nasal spray Place 1 spray into both nostrils daily as needed. 16 g 2   sertraline (ZOLOFT) 100 MG tablet TAKE 1 TABLET BY MOUTH DAILY FOR MOOD 90 tablet 3   tadalafil (CIALIS) 20 MG tablet Take  1/2 to 1 tablet  every 2 to 3 days  as needed for  XXXX 30 tablet 1   No current facility-administered medications on file prior to visit.    Health Maintenance:  Immunization History  Administered Date(s) Administered   Influenza,inj,quad, With Preservative 02/20/2016   PFIZER(Purple Top)SARS-COV-2 Vaccination 08/01/2019,  08/24/2019   Tetanus 02/20/2016   Patient Care Team: Lucky Cowboy, MD as PCP - General (Internal Medicine)  Allergies:  Allergies  Allergen Reactions   Penicillins Hives    Childhood allergy    Medical History:  Past Medical History:  Diagnosis Date   Arthritis    Hyperlipidemia    Morbid obesity with BMI of 45.0-49.9, adult (HCC) 09/27/2019   Sleep apnea     Surgical History:  Past Surgical History:  Procedure Laterality Date   KNEE SURGERY Right 1991   arthroscopic, "cleaning out"    ORIF FINGER FRACTURE Right 2017   right pinky   TONSILLECTOMY Bilateral 1983   VASECTOMY  2014   ? Dr. Vernie Ammons    Family History:  Family History  Problem Relation Age of Onset   Skin cancer Father 38       ? melanoma   Cancer - Other Paternal Grandfather        gallbladder   Colon cancer Neg Hx    Colon polyps Neg Hx    Esophageal cancer Neg Hx    Rectal cancer Neg Hx    Stomach cancer Neg Hx  Social History:   Social History   Tobacco Use   Smoking status: Never   Smokeless tobacco: Never  Vaping Use   Vaping status: Never Used  Substance Use Topics   Alcohol use: Yes    Alcohol/week: 0.0 standard drinks of alcohol    Comment: occasional   Drug use: No    Review of Systems:  Review of Systems  Constitutional:  Negative for malaise/fatigue and weight loss.  HENT:  Negative for hearing loss and tinnitus.   Eyes:  Negative for blurred vision and double vision.  Respiratory:  Negative for cough, sputum production, shortness of breath and wheezing.   Cardiovascular:  Negative for chest pain, palpitations, orthopnea, claudication, leg swelling and PND.  Gastrointestinal:  Negative for abdominal pain, blood in stool, constipation, diarrhea, heartburn, melena, nausea and vomiting.  Genitourinary: Negative.   Musculoskeletal:  Negative for joint pain and myalgias.  Skin:  Negative for rash.  Neurological:  Negative for dizziness, tingling, sensory change,  weakness and headaches.  Endo/Heme/Allergies:  Negative for polydipsia.  Psychiatric/Behavioral: Negative.  Negative for depression, memory loss, substance abuse and suicidal ideas. The patient is not nervous/anxious and does not have insomnia.   All other systems reviewed and are negative.   Physical Exam: Estimated body mass index is 37.31 kg/m as calculated from the following:   Height as of this encounter: 5\' 10"  (1.778 m).   Weight as of this encounter: 260 lb (117.9 kg). BP 104/80   Pulse (!) 54   Temp 97.7 F (36.5 C)   Ht 5\' 10"  (1.778 m)   Wt 260 lb (117.9 kg)   SpO2 98%   BMI 37.31 kg/m   General Appearance: Well nourished, in no apparent distress.  Eyes: PERRLA, EOMs, conjunctiva no swelling or erythema ENT/Mouth: Ear canals clear bilaterally with no erythema, swelling, discharge.  Right TM no erythema or edema, or tenderness to palpation.  Left TM WNL with no erythema, bulging, or retractions.  Oropharynx clear and moist with no exudate, swelling, or erythema.  Dentition normal.   Neck: Supple, thyroid normal. No bruits, JVD, cervical adenopathy Respiratory: Respiratory effort normal, BS equal bilaterally without rales, rhonchi, wheezing or stridor.  Cardio: RRR without murmurs, rubs or gallops. Brisk peripheral pulses without edema.  Chest: symmetric, with normal excursions Abdomen: Soft, nontender, no guarding, rebound, hernias, masses, or organomegaly. Genitourinary: Defer, no concerns Musculoskeletal: Full ROM all peripheral extremities,5/5 strength, and normal gait.  Skin: Warm, dry without rashes, lesions, ecchymosis. Benign appearing moles and cherry angiomas.  Neuro: A&Ox3, Cranial nerves intact, reflexes equal bilaterally. Normal muscle tone, no cerebellar symptoms. Sensation intact.  Psych: Normal affect, Insight and Judgment appropriate.   Teddie Mehta 4:06 PM [facility]

## 2022-12-21 NOTE — Patient Instructions (Signed)

## 2023-01-20 ENCOUNTER — Encounter: Payer: Self-pay | Admitting: Nurse Practitioner

## 2023-01-20 MED ORDER — BUPROPION HCL ER (XL) 150 MG PO TB24: 150.0000 mg | ORAL_TABLET | Freq: Two times a day (BID) | ORAL | 2 refills | Status: DC

## 2023-02-14 ENCOUNTER — Other Ambulatory Visit: Payer: Self-pay | Admitting: Nurse Practitioner

## 2023-04-19 ENCOUNTER — Encounter: Payer: BC Managed Care – PPO | Admitting: Nurse Practitioner

## 2023-05-27 ENCOUNTER — Encounter: Payer: BC Managed Care – PPO | Admitting: Nurse Practitioner

## 2023-06-07 ENCOUNTER — Encounter: Payer: BC Managed Care – PPO | Admitting: Nurse Practitioner

## 2023-08-04 ENCOUNTER — Ambulatory Visit: Payer: BC Managed Care – PPO | Admitting: Nurse Practitioner

## 2023-08-20 ENCOUNTER — Ambulatory Visit: Admitting: Family

## 2023-08-20 ENCOUNTER — Encounter: Payer: Self-pay | Admitting: Family

## 2023-08-20 VITALS — BP 120/78 | HR 61 | Temp 98.6°F | Ht 70.0 in | Wt 272.0 lb

## 2023-08-20 DIAGNOSIS — E7849 Other hyperlipidemia: Secondary | ICD-10-CM

## 2023-08-20 DIAGNOSIS — Z79899 Other long term (current) drug therapy: Secondary | ICD-10-CM | POA: Diagnosis not present

## 2023-08-20 DIAGNOSIS — F419 Anxiety disorder, unspecified: Secondary | ICD-10-CM

## 2023-08-20 DIAGNOSIS — E785 Hyperlipidemia, unspecified: Secondary | ICD-10-CM

## 2023-08-20 DIAGNOSIS — E559 Vitamin D deficiency, unspecified: Secondary | ICD-10-CM

## 2023-08-20 DIAGNOSIS — F32A Depression, unspecified: Secondary | ICD-10-CM

## 2023-08-20 LAB — LIPID PANEL
Cholesterol: 123 mg/dL (ref 0–200)
HDL: 45.2 mg/dL (ref >39.00)
LDL Cholesterol: 64 mg/dL (ref 0–99)
NonHDL: 77.63
Total CHOL/HDL Ratio: 3
Triglycerides: 68 mg/dL (ref 0.0–149.0)
VLDL: 13.6 mg/dL (ref 0.0–40.0)

## 2023-08-20 LAB — BASIC METABOLIC PANEL WITH GFR
BUN: 17 mg/dL (ref 6–23)
CO2: 27 meq/L (ref 19–32)
Calcium: 9.2 mg/dL (ref 8.4–10.5)
Chloride: 105 meq/L (ref 96–112)
Creatinine, Ser: 0.96 mg/dL (ref 0.40–1.50)
GFR: 92.1 mL/min (ref >60.00)
Glucose, Bld: 96 mg/dL (ref 70–99)
Potassium: 4.3 meq/L (ref 3.5–5.1)
Sodium: 138 meq/L (ref 135–145)

## 2023-08-20 LAB — HEPATIC FUNCTION PANEL
ALT: 31 U/L (ref 0–53)
AST: 25 U/L (ref 0–37)
Albumin: 4.4 g/dL (ref 3.5–5.2)
Alkaline Phosphatase: 56 U/L (ref 39–117)
Bilirubin, Direct: 0.1 mg/dL (ref 0.0–0.3)
Total Bilirubin: 0.7 mg/dL (ref 0.2–1.2)
Total Protein: 6.5 g/dL (ref 6.0–8.3)

## 2023-08-20 LAB — PSA: PSA: 0.32 ng/mL (ref 0.10–4.00)

## 2023-08-20 LAB — VITAMIN D 25 HYDROXY (VIT D DEFICIENCY, FRACTURES): VITD: 99.79 ng/mL (ref 30.00–100.00)

## 2023-08-20 MED ORDER — BUPROPION HCL ER (XL) 150 MG PO TB24: 150.0000 mg | ORAL_TABLET | Freq: Two times a day (BID) | ORAL | 3 refills | Status: AC

## 2023-08-20 MED ORDER — ATORVASTATIN CALCIUM 80 MG PO TABS
ORAL_TABLET | ORAL | 3 refills | Status: AC
Start: 2023-08-20 — End: ?

## 2023-08-20 MED ORDER — SERTRALINE HCL 100 MG PO TABS
ORAL_TABLET | ORAL | 3 refills | Status: AC
Start: 2023-08-20 — End: ?

## 2023-08-20 NOTE — Progress Notes (Signed)
 Acute Office Visit  Subjective:     Patient ID: Mike Kramer, male    DOB: 05/05/1971, 53 y.o.   MRN: [id number]  Chief Complaint  Patient presents with  . Medication Refill    Patient here for medication refills for Atorvastatin, bupropion, and sertraline.     HPI Patient is in today for medication refills.  He is a former patient of Dr. Astrid Divine and is due to be established with the practice in June.  He has a history of hyperlipidemia, overweight, vitamin D deficiency, and anxiety.  Currently stable on Lipitor, Wellbutrin, and sertraline.  Denies any concerns.  Does not routinely exercise or follow any particular diet.  No feelings of helplessness or hopelessness, no thoughts of death or dying.  Review of Systems  Constitutional: Negative.   Respiratory: Negative.    Cardiovascular: Negative.   Gastrointestinal: Negative.   Genitourinary: Negative.   Musculoskeletal: Negative.   Skin: Negative.   Neurological: Negative.   Psychiatric/Behavioral: Negative.  Negative for depression. The patient is not nervous/anxious.   All other systems reviewed and are negative. Past Medical History:  Diagnosis Date  . Arthritis   . Hyperlipidemia   . Morbid obesity with BMI of 45.0-49.9, adult (HCC) 09/27/2019  . Sleep apnea     Social History   Socioeconomic History  . Marital status: Married    Spouse name: Not on file  . Number of children: 2  . Years of education: Not on file  . Highest education level: Not on file  Occupational History  . Not on file  Tobacco Use  . Smoking status: Never  . Smokeless tobacco: Never  Vaping Use  . Vaping status: Never Used  Substance and Sexual Activity  . Alcohol use: Yes    Alcohol/week: 0.0 standard drinks of alcohol    Comment: occasional  . Drug use: No  . Sexual activity: Yes    Partners: Female    Birth control/protection: Surgical  Other Topics Concern  . Not on file  Social History Narrative  . Not on file   Social  Drivers of Health   Financial Resource Strain: Not on file  Food Insecurity: Not on file  Transportation Needs: Not on file  Physical Activity: Unknown (01/04/2019)   Exercise Vital Sign   . Days of Exercise per Week: 5 days   . Minutes of Exercise per Session: Not on file  Stress: No Stress Concern Present (12/20/2017)   [employer] of Occupational Health - Occupational Stress Questionnaire   . Feeling of Stress : Only a little  Social Connections: Not on file  Intimate Partner Violence: Not on file    Past Surgical History:  Procedure Laterality Date  . KNEE SURGERY Right 1991   arthroscopic, "cleaning out"   . ORIF FINGER FRACTURE Right 2017   right pinky  . TONSILLECTOMY Bilateral 1983  . VASECTOMY  2014   ? Dr. Vernie Ammons    Family History  Problem Relation Age of Onset  . Skin cancer Father 71       ? melanoma  . Cancer - Other Paternal Grandfather        gallbladder  . Colon cancer Neg Hx   . Colon polyps Neg Hx   . Esophageal cancer Neg Hx   . Rectal cancer Neg Hx   . Stomach cancer Neg Hx     Allergies  Allergen Reactions  . Penicillins Hives    Childhood allergy    Current Outpatient Medications on  File Prior to Visit  Medication Sig Dispense Refill  . Cholecalciferol (VITAMIN D) 50 MCG (2000 UT) tablet Take 3 tablets (6,000 Units total) by mouth daily.    . fluticasone (FLONASE) 50 MCG/ACT nasal spray Place 1 spray into both nostrils daily as needed. 16 g 2  . tadalafil (CIALIS) 20 MG tablet Take  1/2 to 1 tablet  every 2 to 3 days  as needed for  XXXX 30 tablet 1   No current facility-administered medications on file prior to visit.    BP 120/78 (BP Location: Left Arm, Patient Position: Sitting, Cuff Size: Normal)   Pulse 61   Temp 98.6 F (37 C) (Oral)   Ht 5\' 10"  (1.778 m)   Wt 272 lb (123.4 kg)   SpO2 95%   BMI 39.03 kg/m chart      Objective:    BP 120/78 (BP Location: Left Arm, Patient Position: Sitting, Cuff Size: Normal)   Pulse  61   Temp 98.6 F (37 C) (Oral)   Ht 5\' 10"  (1.778 m)   Wt 272 lb (123.4 kg)   SpO2 95%   BMI 39.03 kg/m    Physical Exam Vitals and nursing note reviewed.  Constitutional:      Appearance: Normal appearance. He is obese.  Cardiovascular:     Rate and Rhythm: Normal rate and regular rhythm.  Pulmonary:     Breath sounds: Normal breath sounds.  Abdominal:     General: Abdomen is flat. Bowel sounds are normal.     Palpations: Abdomen is soft.  Musculoskeletal:        General: Normal range of motion.     Cervical back: Normal range of motion.  Skin:    General: Skin is warm and dry.  Neurological:     General: No focal deficit present.     Mental Status: He is alert and oriented to person, place, and time.  Psychiatric:        Mood and Affect: Mood normal.        Behavior: Behavior normal.   No results found for any visits on 08/20/23.      Assessment & Plan:   Problem List Items Addressed This Visit     Vitamin D deficiency   Relevant Orders   Vitamin D (25 hydroxy)   Lipid panel   Basic Metabolic Panel (BMET)   Hepatic function panel   PSA   Hyperlipidemia - Primary   Relevant Medications   atorvastatin (LIPITOR) 80 MG tablet   Other Relevant Orders   Vitamin D (25 hydroxy)   Lipid panel   Basic Metabolic Panel (BMET)   Hepatic function panel   PSA   Anxiety   Relevant Medications   buPROPion (WELLBUTRIN XL) 150 MG 24 hr tablet   sertraline (ZOLOFT) 100 MG tablet   Other Relevant Orders   Vitamin D (25 hydroxy)   Lipid panel   Basic Metabolic Panel (BMET)   Hepatic function panel   PSA   Other Visit Diagnoses       Medication management       Relevant Orders   Vitamin D (25 hydroxy)   Lipid panel   Basic Metabolic Panel (BMET)   Hepatic function panel   PSA     Hyperlipemia       Relevant Medications   atorvastatin (LIPITOR) 80 MG tablet   Other Relevant Orders   Vitamin D (25 hydroxy)   Lipid panel   Basic Metabolic Panel (BMET)  Hepatic function panel   PSA   Vitamin D (25 hydroxy)   Lipid panel   Basic Metabolic Panel (BMET)   Hepatic function panel   PSA     Depression, controlled       Relevant Medications   buPROPion (WELLBUTRIN XL) 150 MG 24 hr tablet   sertraline (ZOLOFT) 100 MG tablet   Other Relevant Orders   Vitamin D (25 hydroxy)   Lipid panel   Basic Metabolic Panel (BMET)   Hepatic function panel   PSA       Meds ordered this encounter  Medications  . atorvastatin (LIPITOR) 80 MG tablet    Sig: TAKE 1 TABLET BY MOUTH EVERY DAY FOR CHOLESTEROL    Dispense:  90 tablet    Refill:  3  . buPROPion (WELLBUTRIN XL) 150 MG 24 hr tablet    Sig: Take 1 tablet (150 mg total) by mouth 2 (two) times daily.    Dispense:  180 tablet    Refill:  3  . sertraline (ZOLOFT) 100 MG tablet    Sig: TAKE 1 TABLET BY MOUTH DAILY FOR MOOD    Dispense:  90 tablet    Refill:  3   Call the office if symptoms worsen or persist.  Recheck as scheduled and sooner as needed. No follow-ups on file.  Eulis Foster, FNP

## 2023-08-21 ENCOUNTER — Encounter: Payer: Self-pay | Admitting: Family

## 2023-09-30 ENCOUNTER — Ambulatory Visit: Admitting: Family Medicine

## 2023-10-19 ENCOUNTER — Ambulatory Visit: Admitting: Family Medicine

## 2023-10-19 ENCOUNTER — Encounter: Payer: Self-pay | Admitting: Family Medicine

## 2023-10-19 VITALS — BP 116/78 | HR 58 | Temp 98.7°F | Ht 70.0 in | Wt 270.0 lb

## 2023-10-19 DIAGNOSIS — E782 Mixed hyperlipidemia: Secondary | ICD-10-CM

## 2023-10-19 DIAGNOSIS — F411 Generalized anxiety disorder: Secondary | ICD-10-CM | POA: Diagnosis not present

## 2023-10-19 DIAGNOSIS — E66812 Obesity, class 2: Secondary | ICD-10-CM | POA: Diagnosis not present

## 2023-10-19 DIAGNOSIS — G4733 Obstructive sleep apnea (adult) (pediatric): Secondary | ICD-10-CM | POA: Diagnosis not present

## 2023-10-19 DIAGNOSIS — Z6838 Body mass index (BMI) 38.0-38.9, adult: Secondary | ICD-10-CM

## 2023-10-19 NOTE — Assessment & Plan Note (Signed)
 Stable, continue Wellbutrin  and Zoloft 

## 2023-10-19 NOTE — Assessment & Plan Note (Signed)
-

## 2023-10-19 NOTE — Assessment & Plan Note (Signed)
 Continue efforts in healthy diet and activity level

## 2023-10-19 NOTE — Patient Instructions (Signed)

## 2023-10-19 NOTE — Progress Notes (Signed)
 New Patient Office Visit  Subjective    Patient ID: Random Dobrowski, male    DOB: 05-28-1970  Age: 53 y.o. MRN: [id number]  CC:  Chief Complaint  Patient presents with   Establish Care    No issues    HPI Mike Kramer presents to transfer care today. He is a previous patient of Dr. Cassondra Cliff. He has been seen in this office for medication management in April of this year. Sees [facility] regularly. Sees [facility]   Reports compliance with medication regimen.  Denies other concerns today.  Outpatient Encounter Medications as of 10/19/2023  Medication Sig   atorvastatin  (LIPITOR) 80 MG tablet TAKE 1 TABLET BY MOUTH EVERY DAY FOR CHOLESTEROL   buPROPion  (WELLBUTRIN  XL) 150 MG 24 hr tablet Take 1 tablet (150 mg total) by mouth 2 (two) times daily.   Cholecalciferol (VITAMIN D ) 50 MCG (2000 UT) tablet Take 3 tablets (6,000 Units total) by mouth daily.   fluticasone  (FLONASE ) 50 MCG/ACT nasal spray Place 1 spray into both nostrils daily as needed.   sertraline  (ZOLOFT ) 100 MG tablet TAKE 1 TABLET BY MOUTH DAILY FOR MOOD   tadalafil  (CIALIS ) 20 MG tablet Take  1/2 to 1 tablet  every 2 to 3 days  as needed for  XXXX   No facility-administered encounter medications on file as of 10/19/2023.    Past Medical History:  Diagnosis Date   Arthritis    Hyperlipidemia    Morbid obesity with BMI of 45.0-49.9, adult (HCC) 09/27/2019   Sleep apnea     Past Surgical History:  Procedure Laterality Date   KNEE SURGERY Right 1991   arthroscopic, "cleaning out"    ORIF FINGER FRACTURE Right 2017   right pinky   TONSILLECTOMY Bilateral 1983   VASECTOMY  2014   ? Dr. Ottelin    Family History  Problem Relation Age of Onset   Skin cancer Father 43       ? melanoma   Cancer - Other Paternal Grandfather        gallbladder   Colon cancer Neg Hx    Colon polyps Neg Hx    Esophageal cancer Neg Hx    Rectal cancer Neg Hx    Stomach cancer Neg Hx     Social History    Socioeconomic History   Marital status: Married    Spouse name: Not on file   Number of children: 2   Years of education: Not on file   Highest education level: Not on file  Occupational History   Not on file  Tobacco Use   Smoking status: Never   Smokeless tobacco: Never  Vaping Use   Vaping status: Never Used  Substance and Sexual Activity   Alcohol use: Yes    Alcohol/week: 0.0 standard drinks of alcohol    Comment: occasional   Drug use: No   Sexual activity: Yes    Partners: Female    Birth control/protection: Surgical  Other Topics Concern   Not on file  Social History Narrative   Not on file   Social Drivers of Health   Financial Resource Strain: Not on file  Food Insecurity: Not on file  Transportation Needs: Not on file  Physical Activity: Unknown (01/04/2019)   Exercise Vital Sign    Days of Exercise per Week: 5 days    Minutes of Exercise per Session: Not on file  Stress: No Stress Concern Present (12/20/2017)   [employer] of Occupational Health -  Occupational Stress Questionnaire    Feeling of Stress : Only a little  Social Connections: Not on file  Intimate Partner Violence: Not on file    ROS Per HPI      Objective    Pulse (!) 58   Temp 98.7 F (37.1 C) (Temporal)   Resp (!) 98   Ht 5\' 10"  (1.778 m)   Wt 270 lb (122.5 kg)   BMI 38.74 kg/m   Physical Exam Vitals and nursing note reviewed.  Constitutional:      General: He is not in acute distress.    Appearance: Normal appearance. He is obese.  HENT:     Head: Normocephalic and atraumatic.     Right Ear: External ear normal.     Left Ear: External ear normal.     Nose: Nose normal.     Mouth/Throat:     Mouth: Mucous membranes are moist.     Pharynx: Oropharynx is clear.  Eyes:     Extraocular Movements: Extraocular movements intact.  Cardiovascular:     Rate and Rhythm: Normal rate and regular rhythm.     Pulses: Normal pulses.     Heart sounds: Normal heart sounds.   Pulmonary:     Effort: Pulmonary effort is normal. No respiratory distress.     Breath sounds: Normal breath sounds. No wheezing, rhonchi or rales.  Musculoskeletal:        General: Normal range of motion.     Cervical back: Normal range of motion.     Right lower leg: No edema.     Left lower leg: No edema.  Lymphadenopathy:     Cervical: No cervical adenopathy.  Skin:    General: Skin is warm and dry.  Neurological:     General: No focal deficit present.     Mental Status: He is alert and oriented to person, place, and time.  Psychiatric:        Mood and Affect: Mood normal.        Behavior: Behavior normal.         Assessment & Plan:   GAD (generalized anxiety disorder) Assessment & Plan: Stable, continue Wellbutrin  and Zoloft    Mixed hyperlipidemia Assessment & Plan: Stable, continue atorvastatin    OSA (obstructive sleep apnea) Assessment & Plan: Stable, continue CPAP   Class 2 severe obesity due to excess calories with serious comorbidity and body mass index (BMI) of 38.0 to 38.9 in adult [facility]) Assessment & Plan: Continue efforts in healthy diet and activity level      Return in about 4 months (around 02/18/2024) for CPE.   Wellington Half, FNP

## 2023-10-19 NOTE — Assessment & Plan Note (Signed)
Stable, continue CPAP

## 2023-11-10 NOTE — Progress Notes (Signed)
 [facility] Quality Team Note  Name: Mike Kramer Date of Birth: 1970/06/25 MRN: [id number] Date: 11/10/2023  [facility] Quality Team has reviewed this patient's chart, please see recommendations below:  [facility] Quality Other; (CHART REVIEWED. ABSTRACTED MOST RECENT BLOOD PRESSURE FOR GAP CLOSURE.)

## 2024-02-18 ENCOUNTER — Encounter: Payer: Self-pay | Admitting: Family Medicine

## 2024-02-18 ENCOUNTER — Ambulatory Visit: Admitting: Family Medicine

## 2024-02-18 VITALS — BP 126/80 | HR 50 | Temp 98.5°F | Ht 70.0 in | Wt 272.4 lb

## 2024-02-18 DIAGNOSIS — Z0000 Encounter for general adult medical examination without abnormal findings: Secondary | ICD-10-CM

## 2024-02-18 DIAGNOSIS — F411 Generalized anxiety disorder: Secondary | ICD-10-CM

## 2024-02-18 DIAGNOSIS — E66812 Obesity, class 2: Secondary | ICD-10-CM | POA: Diagnosis not present

## 2024-02-18 DIAGNOSIS — Z23 Encounter for immunization: Secondary | ICD-10-CM | POA: Diagnosis not present

## 2024-02-18 DIAGNOSIS — E782 Mixed hyperlipidemia: Secondary | ICD-10-CM

## 2024-02-18 DIAGNOSIS — E559 Vitamin D deficiency, unspecified: Secondary | ICD-10-CM

## 2024-02-18 DIAGNOSIS — Z6838 Body mass index (BMI) 38.0-38.9, adult: Secondary | ICD-10-CM

## 2024-02-18 DIAGNOSIS — R7303 Prediabetes: Secondary | ICD-10-CM

## 2024-02-18 DIAGNOSIS — G4733 Obstructive sleep apnea (adult) (pediatric): Secondary | ICD-10-CM | POA: Diagnosis not present

## 2024-02-18 LAB — LIPID PANEL
Cholesterol: 144 mg/dL (ref 0–200)
HDL: 42.1 mg/dL (ref >39.00)
LDL Cholesterol: 74 mg/dL (ref 0–99)
NonHDL: 101.99
Total CHOL/HDL Ratio: 3
Triglycerides: 140 mg/dL (ref 0.0–149.0)
VLDL: 28 mg/dL (ref 0.0–40.0)

## 2024-02-18 LAB — COMPREHENSIVE METABOLIC PANEL WITH GFR
ALT: 29 U/L (ref 0–53)
AST: 22 U/L (ref 0–37)
Albumin: 4.3 g/dL (ref 3.5–5.2)
Alkaline Phosphatase: 51 U/L (ref 39–117)
BUN: 19 mg/dL (ref 6–23)
CO2: 24 meq/L (ref 19–32)
Calcium: 9 mg/dL (ref 8.4–10.5)
Chloride: 106 meq/L (ref 96–112)
Creatinine, Ser: 1.13 mg/dL (ref 0.40–1.50)
GFR: 75.47 mL/min (ref >60.00)
Glucose, Bld: 94 mg/dL (ref 70–99)
Potassium: 3.9 meq/L (ref 3.5–5.1)
Sodium: 138 meq/L (ref 135–145)
Total Bilirubin: 0.5 mg/dL (ref 0.2–1.2)
Total Protein: 6.9 g/dL (ref 6.0–8.3)

## 2024-02-18 LAB — CBC WITH DIFFERENTIAL/PLATELET
Basophils Absolute: 0.1 K/uL (ref 0.0–0.1)
Basophils Relative: 0.9 % (ref 0.0–3.0)
Eosinophils Absolute: 0.2 K/uL (ref 0.0–0.7)
Eosinophils Relative: 3.4 % (ref 0.0–5.0)
HCT: 45.7 % (ref 39.0–52.0)
Hemoglobin: 15 g/dL (ref 13.0–17.0)
Lymphocytes Relative: 25.8 % (ref 12.0–46.0)
Lymphs Abs: 1.7 K/uL (ref 0.7–4.0)
MCHC: 32.9 g/dL (ref 30.0–36.0)
MCV: 91.5 fl (ref 78.0–100.0)
Monocytes Absolute: 0.5 K/uL (ref 0.1–1.0)
Monocytes Relative: 7.5 % (ref 3.0–12.0)
Neutro Abs: 4.1 K/uL (ref 1.4–7.7)
Neutrophils Relative %: 62.4 % (ref 43.0–77.0)
Platelets: 208 K/uL (ref 150.0–400.0)
RBC: 4.99 Mil/uL (ref 4.22–5.81)
RDW: 13.6 % (ref 11.5–15.5)
WBC: 6.5 K/uL (ref 4.0–10.5)

## 2024-02-18 LAB — TSH: TSH: 1.56 u[IU]/mL (ref 0.35–5.50)

## 2024-02-18 LAB — VITAMIN D 25 HYDROXY (VIT D DEFICIENCY, FRACTURES): VITD: 64.41 ng/mL (ref 30.00–100.00)

## 2024-02-18 LAB — HEMOGLOBIN A1C: Hgb A1c MFr Bld: 5.6 % (ref 4.6–6.5)

## 2024-02-18 LAB — VITAMIN B12: Vitamin B-12: 407 pg/mL (ref 211–911)

## 2024-02-18 NOTE — Patient Instructions (Addendum)
 We have completed your physical today.   We are checking labs today, will be in contact with any results that require further attention  We have updated your tetanus vaccine today.  Please make a separate nurse visit to receive your Shingrix vaccine.  Follow-up with me in 6 mos for medication management, sooner if needed.

## 2024-02-18 NOTE — Progress Notes (Signed)
 Annual Physical Exam  Subjective:     Patient ID: Mike Kramer, male    DOB: [birth year], 53 y.o.   MRN: [id number]  Chief Complaint  Patient presents with   Medical Management of Chronic Issues    CPE    HPI  Discussed the use of AI scribe software for clinical note transcription with the patient, who gave verbal consent to proceed.  History of Present Illness Mike Kramer is a 53 year old male who presents for an annual physical exam.  Obstructive sleep apnea - Uses CPAP machine regularly - Notices a difference in symptoms when CPAP is not used - No daytime fatigue - Sleeps well  Mood and anxiety symptoms - Takes Wellbutrin  300 mg daily in two doses and Zoloft  100 mg daily - No significant issues with depression or anxiety  Substance use - Occasionally drinks alcohol - No tobacco use  Immunization status - No influenza vaccine received this season - Tetanus vaccine last received in 2017, due for booster - History of chickenpox, no shingles vaccine received - Uncertain hepatitis B vaccination status  Dermatologic and peripheral edema symptoms - No new skin issues - No swelling in feet     ROS Per HPI  Most recent fall risk assessment:    02/18/2024    3:03 PM  Fall Risk   Falls in the past year? 0  Risk for fall due to : No Fall Risks    Most recent depression screenings:    02/18/2024    3:03 PM 10/19/2023    1:52 PM  PHQ 2/9 Scores  PHQ - 2 Score 1 0  PHQ- 9 Score 5     Vision:Within last year and Dental: No current dental problems and Receives regular dental care  Patient Care Team: Alvia Corean CROME, FNP as PCP - General (Family Medicine)   Outpatient Medications Prior to Visit  Medication Sig   atorvastatin  (LIPITOR) 80 MG tablet TAKE 1 TABLET BY MOUTH EVERY DAY FOR CHOLESTEROL   buPROPion  (WELLBUTRIN  XL) 150 MG 24 hr tablet Take 1 tablet (150 mg total) by mouth 2 (two) times daily.   Cholecalciferol (VITAMIN D ) 50 MCG (2000 UT)  tablet Take 3 tablets (6,000 Units total) by mouth daily.   fluticasone  (FLONASE ) 50 MCG/ACT nasal spray Place 1 spray into both nostrils daily as needed.   sertraline  (ZOLOFT ) 100 MG tablet TAKE 1 TABLET BY MOUTH DAILY FOR MOOD   tadalafil  (CIALIS ) 20 MG tablet Take  1/2 to 1 tablet  every 2 to 3 days  as needed for  XXXX   No facility-administered medications prior to visit.       Objective:    BP 126/80 (BP Location: Left Arm, Patient Position: Sitting)   Pulse (!) 50   Temp 98.5 F (36.9 C) (Oral)   Ht 5' 10 (1.778 m)   Wt 272 lb 6.4 oz (123.6 kg)   SpO2 95%   BMI 39.09 kg/m    Physical Exam Vitals and nursing note reviewed.  Constitutional:      General: He is not in acute distress.    Appearance: Normal appearance. He is obese.  HENT:     Head: Normocephalic and atraumatic.     Right Ear: External ear normal.     Left Ear: External ear normal.     Nose: Nose normal.     Mouth/Throat:     Mouth: Mucous membranes are moist.     Pharynx: Oropharynx is clear.  Eyes:  Extraocular Movements: Extraocular movements intact.  Cardiovascular:     Rate and Rhythm: Regular rhythm. Bradycardia present.     Pulses: Normal pulses.     Heart sounds: Normal heart sounds.  Pulmonary:     Effort: Pulmonary effort is normal. No respiratory distress.     Breath sounds: Normal breath sounds. No wheezing, rhonchi or rales.  Musculoskeletal:        General: Normal range of motion.     Cervical back: Normal range of motion.     Right lower leg: No edema.     Left lower leg: No edema.  Lymphadenopathy:     Cervical: No cervical adenopathy.  Skin:    General: Skin is warm and dry.  Neurological:     General: No focal deficit present.     Mental Status: He is alert and oriented to person, place, and time.  Psychiatric:        Mood and Affect: Mood normal.        Behavior: Behavior normal.     No results found for any visits on 02/18/24.  BP Readings from Last 3  Encounters:  02/18/24 126/80  10/19/23 116/78  08/20/23 120/78   Wt Readings from Last 3 Encounters:  02/18/24 272 lb 6.4 oz (123.6 kg)  10/19/23 270 lb (122.5 kg)  08/20/23 272 lb (123.4 kg)      Last CBC Lab Results  Component Value Date   WBC 7.2 12/21/2022   HGB 14.6 12/21/2022   HCT 43.1 12/21/2022   MCV 92.7 12/21/2022   MCH 31.4 12/21/2022   RDW 12.8 12/21/2022   PLT 189 12/21/2022   Last metabolic panel Lab Results  Component Value Date   GLUCOSE 96 08/20/2023   NA 138 08/20/2023   K 4.3 08/20/2023   CL 105 08/20/2023   CO2 27 08/20/2023   BUN 17 08/20/2023   CREATININE 0.96 08/20/2023   GFR 92.10 08/20/2023   CALCIUM  9.2 08/20/2023   PROT 6.5 08/20/2023   ALBUMIN 4.4 08/20/2023   BILITOT 0.7 08/20/2023   ALKPHOS 56 08/20/2023   AST 25 08/20/2023   ALT 31 08/20/2023   Last lipids Lab Results  Component Value Date   CHOL 123 08/20/2023   HDL 45.20 08/20/2023   LDLCALC 64 08/20/2023   TRIG 68.0 08/20/2023   CHOLHDL 3 08/20/2023   Last hemoglobin A1c Lab Results  Component Value Date   HGBA1C 5.4 05/26/2022   Last thyroid  functions Lab Results  Component Value Date   TSH 1.42 05/26/2022   Last vitamin D  Lab Results  Component Value Date   VD25OH 99.79 08/20/2023   Last vitamin B12 and Folate Lab Results  Component Value Date   VITAMINB12 455 01/04/2019         Assessment & Plan:   Assessment and Plan Assessment & Plan Adult Wellness Visit Routine wellness visit with no depression or anxiety. Reviewed medications, seatbelt use, alcohol, smoking, and vaccinations. Discussed hepatitis B vaccine, determined low risk. Reviewed cancer screening schedule. - Administer tetanus vaccine today. - Schedule shingles vaccine for future visit. - Provide information on tetanus and shingles vaccines. - Perform lab work for liver and kidney function due to cholesterol medication. - Schedule follow-up in six months for medication review and  screening.  Mixed hyperlipidemia Managed with Atorvastatin  80 mg daily. - Monitor liver function due to statin use.  Obstructive sleep apnea Managed with CPAP.  Generalized anxiety disorder  Well-managed with Bupropion  300 mg daily and Sertraline  100  mg daily.  Class 2 severe obesity (BMI 38.0-38.9) Class 2 severe obesity with BMI 38.0-38.9. - Continue efforts in healthy diet and activity level  Vitamin D  deficiency - Not currently taking vitamin D , will check levels today and treat as indicated  Prediabetes - CMP, A1c today  Medication management - Labs today, will dose adjust as needed     Health Maintenance  Topic Date Due   COVID-19 Vaccine (3 - Pfizer risk series) 03/05/2024*   Zoster (Shingles) Vaccine (1 of 2) 03/06/2024*   Flu Shot  08/08/2024*   Pneumococcal Vaccine for age over 55 (1 of 1 - PCV) 02/17/2025*   Colon Cancer Screening  11/22/2031   DTaP/Tdap/Td vaccine (2 - Td or Tdap) 02/17/2034   HPV Vaccine  Aged Out   Meningitis B Vaccine  Aged Out   Hepatitis B Vaccine  Discontinued   Hepatitis C Screening  Discontinued   HIV Screening  Discontinued  *Topic was postponed. The date shown is not the original due date.     Discussed health benefits of physical activity, and encouraged him to engage in regular exercise appropriate for his age and condition.  Orders Placed This Encounter  Procedures   Tdap vaccine greater than or equal to 7yo IM   CBC with Differential/Platelet    Release to patient:   Immediate [1]   Comprehensive metabolic panel with GFR    Release to patient:   Immediate [1]   Hemoglobin A1c   Lipid panel   TSH   Vitamin B12   VITAMIN D  25 Hydroxy (Vit-D Deficiency, Fractures)     No orders of the defined types were placed in this encounter.   Return in about 6 months (around 08/18/2024) for med mgt, sooner for nurse visit for shingrix #1.  Corean LITTIE Ku, FNP

## 2024-02-19 ENCOUNTER — Ambulatory Visit: Payer: Self-pay | Admitting: Family Medicine

## 2024-06-06 ENCOUNTER — Encounter: Payer: BC Managed Care – PPO | Admitting: Nurse Practitioner

## 2025-02-23 ENCOUNTER — Encounter: Admitting: Family Medicine
